# Patient Record
Sex: Male | Born: 1965
Health system: Southern US, Community
[De-identification: ages and names within clinical notes are randomized; demographics above are authoritative.]

## PROBLEM LIST (undated history)

## (undated) DIAGNOSIS — I10 Essential (primary) hypertension: Secondary | ICD-10-CM

## (undated) DIAGNOSIS — M1990 Unspecified osteoarthritis, unspecified site: Secondary | ICD-10-CM

## (undated) HISTORY — DX: Essential (primary) hypertension: I10

## (undated) HISTORY — PX: FINGER FRACTURE SURGERY: SHX638

## (undated) HISTORY — DX: Unspecified osteoarthritis, unspecified site: M19.90

---

## 2007-07-02 ENCOUNTER — Encounter: Admission: RE | Admit: 2007-07-02 | Discharge: 2007-07-02 | Payer: Self-pay | Admitting: Family Medicine

## 2011-09-22 ENCOUNTER — Ambulatory Visit (INDEPENDENT_AMBULATORY_CARE_PROVIDER_SITE_OTHER): Payer: 59 | Admitting: Family Medicine

## 2011-09-22 VITALS — BP 147/81 | HR 67 | Temp 98.0°F | Resp 20 | Ht 71.0 in | Wt 187.0 lb

## 2011-09-22 DIAGNOSIS — J019 Acute sinusitis, unspecified: Secondary | ICD-10-CM

## 2011-09-22 MED ORDER — CEFDINIR 300 MG PO CAPS: 300.0000 mg | ORAL_CAPSULE | Freq: Two times a day (BID) | ORAL | Status: AC

## 2011-09-22 NOTE — Progress Notes (Signed)
  Patient Name: Isaac Gray Date of Birth: [birth year] Medical Record Number: [id number] Gender: male Date of Encounter: 09/22/2011  History of Present Illness:  Isaac Gray is a 46 y.o. very pleasant male patient who presents with the following:  About a week ago noted minor congestion in his sinuses which has gotten worse.  Notes pressure and pain in his eyes.  Also chest congestion.  Cough- not very productive. No ST, a little bit of ringing/ pressure in his ears.  No fever or chills, today notes a poor energy level.  No aches.  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    There is no problem list on file for this patient.  History reviewed. No pertinent past medical history. Past Surgical History  Procedure Date  . Finger fracture surgery     at 46 years old   History  Substance Use Topics  . Smoking status: Never Smoker   . Smokeless tobacco: Never Used  . Alcohol Use: Yes     socially   Family History  Problem Relation Age of Onset  . Cancer Paternal Grandmother    No Known Allergies  Medication list has been reviewed and updated.  Review of Systems: As per HPI, otherwise negative.  No GI symptoms except poor appetite  Physical Examination: Filed Vitals:   09/22/11 1320  BP: 147/81  Pulse: 67  Temp: 98 F (36.7 C)  TempSrc: Oral  Resp: 20  Height: 5\' 11"  (1.803 m)  Weight: 187 lb (84.823 kg)    Body mass index is 26.08 kg/(m^2).  GEN: WDWN, NAD, Non-toxic, A & O x 3 HEENT: Atraumatic, Normocephalic. Neck supple. No masses, No LAD.  Mild frontal sinus TTP Ears and Nose: No external deformity.  Oropharynx and bilateral TM wnl CV: RRR, No M/G/R. No JVD. No thrill. No extra heart sounds. PULM: CTA B, no wheezes, crackles, rhonchi. No retractions. No resp. distress. No accessory muscle use. EXTR: No c/c/e NEURO Normal gait.  PSYCH: Normally interactive. Conversant. Not depressed or anxious appearing.  Calm demeanor.     Assessment and Plan: 1. Sinusitis, acute  cefdinir (OMNICEF) 300 MG capsule   Patient has a sinus infection.  This may be viral in origin, but he has been training for a 1/2 marathon which is this weekend and is concerned that he will not be able to run his race.  Will go ahead and cover with omnicef as above.  Encouraged rest, fluids, and let us know if he is not feeling better in the next few days- Sooner if worse.

## 2012-01-30 ENCOUNTER — Encounter: Payer: Self-pay | Admitting: *Deleted

## 2012-01-30 ENCOUNTER — Emergency Department: Admit: 2012-01-30 | Discharge: 2012-01-30 | Disposition: A | Discharge status: Home / Self Care | Payer: 59

## 2012-01-30 ENCOUNTER — Emergency Department
Admission: EM | Admit: 2012-01-30 | Discharge: 2012-01-30 | Disposition: A | Discharge status: Home / Self Care | Payer: 59 | Source: Home / Self Care | Attending: Family Medicine | Admitting: Family Medicine

## 2012-01-30 DIAGNOSIS — M940 Chondrocostal junction syndrome [Tietze]: Secondary | ICD-10-CM

## 2012-01-30 LAB — POCT CBC W AUTO DIFF (K'VILLE URGENT CARE)

## 2012-01-30 NOTE — ED Provider Notes (Signed)
History     CSN: [id number]  Arrival date & time 01/30/12  1204   First MD Initiated Contact with Patient 01/30/12 1221      Chief Complaint  Patient presents with  . Cough  . Chest Pain    tight     HPI Comments: Patient complains of two week history of vague constant tightness in his anterior chest, and increased fatigue with activity but no specific shortness of breath or wheezing.  He has had an occasional mild non-productive cough.  No pleuritic pain.  He has had mild nasal congestion and post nasal drainage.  No fevers, chills, and sweats.  He notes that several co-workers have had similar respiratory symptoms with chest tightness, and some with more cough than he.  He is able to run and exercise shortness of breath difficulty.                                                                                                   The history is provided by the patient.    History reviewed. No pertinent past medical history.  Past Surgical History  Procedure Date  . Finger fracture surgery     at 46 years old    Family History  Problem Relation Age of Onset  . Cancer Paternal Grandmother   . Hypertension Mother   . Diabetes Father     History  Substance Use Topics  . Smoking status: Never Smoker   . Smokeless tobacco: Never Used  . Alcohol Use: Yes     socially      Review of Systems No sore throat + cough, occasional No pleuritic pain, but complains of tightness in anterior chest No wheezing + nasal congestion, mild + post-nasal drainage No sinus pain/pressure No itchy/red eyes No earache No hemoptysis No SOB No fever/chills No nausea No vomiting No abdominal pain No diarrhea No urinary symptoms No skin rashes + fatigue No myalgias + headache   Allergies  Review of patient's allergies indicates no known allergies.  Home Medications  No current outpatient prescriptions on file.  BP 143/88  Pulse 60  Temp 98.4 F (36.9 C) (Oral)  Resp 14   Ht 5\' 11"  (1.803 m)  Wt 188 lb (85.276 kg)  BMI 26.22 kg/m2  SpO2 98%  Physical Exam Nursing notes and Vital Signs reviewed. Appearance:  Patient appears healthy, stated age, and in no acute distress Eyes:  Pupils are equal, round, and reactive to light and accomodation.  Extraocular movement is intact.  Conjunctivae are not inflamed  Ears:  Canals normal.  Tympanic membranes normal.  Nose:  Mildly congested turbinates.  No sinus tenderness.  Pharynx:  Normal Neck:  Supple.   No adenopathy Lungs:  Clear to auscultation.  Breath sounds are equal  Chest:  Nontender. Heart:  Regular rate and rhythm without murmurs, rubs, or gallops.  Abdomen:  Nontender without masses or hepatosplenomegaly.  Bowel sounds are present.  No CVA or flank tenderness.  Extremities:  No edema.  No calf tenderness Skin:  No rash present.   ED Course  Procedures  none   Labs Reviewed  POCT CBC W AUTO DIFF ([facility])  WBC 6.1; LY 27.8; MO 6.6; GR 65.8; Hgb 13.8; Platelets 221    Dg Chest 2 View  01/30/2012  *RADIOLOGY REPORT*  Clinical Data: Chest tightness  CHEST - 2 VIEW  Comparison: None.  Findings: Lungs are clear. No pleural effusion or pneumothorax. The cardiomediastinal contours are within normal limits. The visualized bones and soft tissues are without significant appreciable abnormality.  IMPRESSION: No radiographic evidence of acute cardiopulmonary process.  Original Report Authenticated By: Waneta Martins, M.D.     1. Costochondritis, acute       MDM   Take Ibuprofen 200mg , 4 tabs every 8 hours with food.  Followup with Family Doctor if not improved in two weeks        Lattie Haw, MD 01/30/12 1416

## 2012-01-30 NOTE — Discharge Instructions (Signed)
Recommend Ibuprofen 200mg , 4 tabs every 8 hours with food.    Costochondritis Costochondritis (Tietze syndrome), or costochondral separation, is a swelling and irritation (inflammation) of the tissue (cartilage) that connects your ribs with your breastbone (sternum). It may occur on its own (spontaneously), through damage caused by an accident (trauma), or simply from coughing or minor exercise. It may take up to 6 weeks to get better and longer if you are unable to be conservative in your activities. HOME CARE INSTRUCTIONS   Avoid exhausting physical activity. Try not to strain your ribs during normal activity. This would include any activities using chest, belly (abdominal), and side muscles, especially if heavy weights are used.   Use ice for 15 to 20 minutes per hour while awake for the first 2 days. Place the ice in a plastic bag, and place a towel between the bag of ice and your skin.   Only take over-the-counter or prescription medicines for pain, discomfort, or fever as directed by your caregiver.  SEEK IMMEDIATE MEDICAL CARE IF:   Your pain increases or you are very uncomfortable.   You have a fever.   You develop difficulty with your breathing.   You cough up blood.   You develop worse chest pains, shortness of breath, sweating, or vomiting.   You develop new, unexplained problems (symptoms).  MAKE SURE YOU:   Understand these instructions.   Will watch your condition.   Will get help right away if you are not doing well or get worse.  Document Released: 05/07/2005 Document Revised: 07/17/2011 Document Reviewed: 03/15/2008 [facility] Patient Information 2012 [place], [place].

## 2012-01-30 NOTE — ED Notes (Signed)
Patient c/o chest tightness, cough and fatigue x 2 weeks.

## 2016-05-26 ENCOUNTER — Ambulatory Visit (INDEPENDENT_AMBULATORY_CARE_PROVIDER_SITE_OTHER): Payer: 59 | Admitting: Family

## 2016-05-26 ENCOUNTER — Encounter: Payer: Self-pay | Admitting: Family

## 2016-05-26 ENCOUNTER — Other Ambulatory Visit (INDEPENDENT_AMBULATORY_CARE_PROVIDER_SITE_OTHER): Payer: 59

## 2016-05-26 VITALS — BP 132/98 | HR 67 | Temp 98.3°F | Resp 16 | Ht 71.0 in | Wt 205.8 lb

## 2016-05-26 DIAGNOSIS — Z0000 Encounter for general adult medical examination without abnormal findings: Secondary | ICD-10-CM

## 2016-05-26 DIAGNOSIS — Z0001 Encounter for general adult medical examination with abnormal findings: Secondary | ICD-10-CM | POA: Insufficient documentation

## 2016-05-26 DIAGNOSIS — Z23 Encounter for immunization: Secondary | ICD-10-CM

## 2016-05-26 LAB — CBC
HCT: 41.9 % (ref 39.0–52.0)
Hemoglobin: 14.3 g/dL (ref 13.0–17.0)
MCHC: 34.2 g/dL (ref 30.0–36.0)
MCV: 93 fl (ref 78.0–100.0)
Platelets: 270 10*3/uL (ref 150.0–400.0)
RBC: 4.51 Mil/uL (ref 4.22–5.81)
RDW: 13.3 % (ref 11.5–15.5)
WBC: 7 10*3/uL (ref 4.0–10.5)

## 2016-05-26 LAB — COMPREHENSIVE METABOLIC PANEL
ALT: 15 U/L (ref 0–53)
AST: 17 U/L (ref 0–37)
Albumin: 4.8 g/dL (ref 3.5–5.2)
Alkaline Phosphatase: 51 U/L (ref 39–117)
BILIRUBIN TOTAL: 0.5 mg/dL (ref 0.2–1.2)
BUN: 17 mg/dL (ref 6–23)
CHLORIDE: 104 meq/L (ref 96–112)
CO2: 31 meq/L (ref 19–32)
CREATININE: 1.12 mg/dL (ref 0.40–1.50)
Calcium: 10.2 mg/dL (ref 8.4–10.5)
GFR: 74.03 mL/min (ref >60.00)
Glucose, Bld: 99 mg/dL (ref 70–99)
Potassium: 5.6 mEq/L — ABNORMAL HIGH (ref 3.5–5.1)
SODIUM: 142 meq/L (ref 135–145)
Total Protein: 7.5 g/dL (ref 6.0–8.3)

## 2016-05-26 LAB — LIPID PANEL
CHOL/HDL RATIO: 2
Cholesterol: 143 mg/dL (ref 0–200)
HDL: 59 mg/dL (ref >39.00)
LDL CALC: 65 mg/dL (ref 0–99)
NONHDL: 83.95
Triglycerides: 96 mg/dL (ref 0.0–149.0)
VLDL: 19.2 mg/dL (ref 0.0–40.0)

## 2016-05-26 NOTE — Patient Instructions (Signed)
Thank you for choosing [facility].  SUMMARY AND INSTRUCTIONS:   Labs:  Please stop by the lab on the lower level of the building for your blood work. Your results will be released to MyChart (or called to you) after review, usually within 72 hours after test completion. If any changes need to be made, you will be notified at that same time.  1.) The lab is open from 7:30am to 5:30 pm Monday-Friday 2.) No appointment is necessary 3.) Fasting (if needed) is 6-8 hours after food and drink; black coffee and water are okay   Health Maintenance, Male A healthy lifestyle and preventative care can promote health and wellness.  Maintain regular health, dental, and eye exams.  Eat a healthy diet. Foods like vegetables, fruits, whole grains, low-fat dairy products, and lean protein foods contain the nutrients you need and are low in calories. Decrease your intake of foods high in solid fats, added sugars, and salt. Get information about a proper diet from your health care provider, if necessary.  Regular physical exercise is one of the most important things you can do for your health. Most adults should get at least 150 minutes of moderate-intensity exercise (any activity that increases your heart rate and causes you to sweat) each week. In addition, most adults need muscle-strengthening exercises on 2 or more days a week.   Maintain a healthy weight. The body mass index (BMI) is a screening tool to identify possible weight problems. It provides an estimate of body fat based on height and weight. Your health care provider can find your BMI and can help you achieve or maintain a healthy weight. For males 20 years and older:  A BMI below 18.5 is considered underweight.  A BMI of 18.5 to 24.9 is normal.  A BMI of 25 to 29.9 is considered overweight.  A BMI of 30 and above is considered obese.  Maintain normal blood lipids and cholesterol by exercising and minimizing your intake of  saturated fat. Eat a balanced diet with plenty of fruits and vegetables. Blood tests for lipids and cholesterol should begin at age 20 and be repeated every 5 years. If your lipid or cholesterol levels are high, you are over age 50, or you are at high risk for heart disease, you may need your cholesterol levels checked more frequently.Ongoing high lipid and cholesterol levels should be treated with medicines if diet and exercise are not working.  If you smoke, find out from your health care provider how to quit. If you do not use tobacco, do not start.  Lung cancer screening is recommended for adults aged 55-80 years who are at high risk for developing lung cancer because of a history of smoking. A yearly low-dose CT scan of the lungs is recommended for people who have at least a 30-pack-year history of smoking and are current smokers or have quit within the past 15 years. A pack year of smoking is smoking an average of 1 pack of cigarettes a day for 1 year (for example, a 30-pack-year history of smoking could mean smoking 1 pack a day for 30 years or 2 packs a day for 15 years). Yearly screening should continue until the smoker has stopped smoking for at least 15 years. Yearly screening should be stopped for people who develop a health problem that would prevent them from having lung cancer treatment.  If you choose to drink alcohol, do not have more than 2 drinks per day. One drink is considered   to be 12 oz (360 mL) of beer, 5 oz (150 mL) of wine, or 1.5 oz (45 mL) of liquor.  Avoid the use of street drugs. Do not share needles with anyone. Ask for help if you need support or instructions about stopping the use of drugs.  High blood pressure causes heart disease and increases the risk of stroke. High blood pressure is more likely to develop in:  People who have blood pressure in the end of the normal range (100-139/85-89 mm Hg).  People who are overweight or obese.  People who are African  American.  If you are 18-39 years of age, have your blood pressure checked every 3-5 years. If you are 40 years of age or older, have your blood pressure checked every year. You should have your blood pressure measured twice--once when you are at a hospital or clinic, and once when you are not at a hospital or clinic. Record the average of the two measurements. To check your blood pressure when you are not at a hospital or clinic, you can use:  An automated blood pressure machine at a pharmacy.  A home blood pressure monitor.  If you are 45-79 years old, ask your health care provider if you should take aspirin to prevent heart disease.  Diabetes screening involves taking a blood sample to check your fasting blood sugar level. This should be done once every 3 years after age 45 if you are at a normal weight and without risk factors for diabetes. Testing should be considered at a younger age or be carried out more frequently if you are overweight and have at least 1 risk factor for diabetes.  Colorectal cancer can be detected and often prevented. Most routine colorectal cancer screening begins at the age of 50 and continues through age 75. However, your health care provider may recommend screening at an earlier age if you have risk factors for colon cancer. On a yearly basis, your health care provider may provide home test kits to check for hidden blood in the stool. A small camera at the end of a tube may be used to directly examine the colon (sigmoidoscopy or colonoscopy) to detect the earliest forms of colorectal cancer. Talk to your health care provider about this at age 50 when routine screening begins. A direct exam of the colon should be repeated every 5-10 years through age 75, unless early forms of precancerous polyps or small growths are found.  People who are at an increased risk for hepatitis B should be screened for this virus. You are considered at high risk for hepatitis B if:  You were  born in a country where hepatitis B occurs often. Talk with your health care provider about which countries are considered high risk.  Your parents were born in a high-risk country and you have not received a shot to protect against hepatitis B (hepatitis B vaccine).  You have HIV or AIDS.  You use needles to inject street drugs.  You live with, or have sex with, someone who has hepatitis B.  You are a man who has sex with other men (MSM).  You get hemodialysis treatment.  You take certain medicines for conditions like cancer, organ transplantation, and autoimmune conditions.  Hepatitis C blood testing is recommended for all people born from 1945 through 1965 and any individual with known risk factors for hepatitis C.  Healthy men should no longer receive prostate-specific antigen (PSA) blood tests as part of routine cancer screening. Talk   to your health care provider about prostate cancer screening.  Testicular cancer screening is not recommended for adolescents or adult males who have no symptoms. Screening includes self-exam, a health care provider exam, and other screening tests. Consult with your health care provider about any symptoms you have or any concerns you have about testicular cancer.  Practice safe sex. Use condoms and avoid high-risk sexual practices to reduce the spread of sexually transmitted infections (STIs).  You should be screened for STIs, including gonorrhea and chlamydia if:  You are sexually active and are younger than 24 years.  You are older than 24 years, and your health care provider tells you that you are at risk for this type of infection.  Your sexual activity has changed since you were last screened, and you are at an increased risk for chlamydia or gonorrhea. Ask your health care provider if you are at risk.  If you are at risk of being infected with HIV, it is recommended that you take a prescription medicine daily to prevent HIV infection. This is  called pre-exposure prophylaxis (PrEP). You are considered at risk if:  You are a man who has sex with other men (MSM).  You are a heterosexual man who is sexually active with multiple partners.  You take drugs by injection.  You are sexually active with a partner who has HIV.  Talk with your health care provider about whether you are at high risk of being infected with HIV. If you choose to begin PrEP, you should first be tested for HIV. You should then be tested every 3 months for as long as you are taking PrEP.  Use sunscreen. Apply sunscreen liberally and repeatedly throughout the day. You should seek shade when your shadow is shorter than you. Protect yourself by wearing long sleeves, pants, a wide-brimmed hat, and sunglasses year round whenever you are outdoors.  Tell your health care provider of new moles or changes in moles, especially if there is a change in shape or color. Also, tell your health care provider if a mole is larger than the size of a pencil eraser.  A one-time screening for abdominal aortic aneurysm (AAA) and surgical repair of large AAAs by ultrasound is recommended for men aged 65-75 years who are current or former smokers.  Stay current with your vaccines (immunizations).   This information is not intended to replace advice given to you by your health care provider. Make sure you discuss any questions you have with your health care provider.   Document Released: 01/24/2008 Document Revised: 08/18/2014 Document Reviewed: 12/23/2010 Elsevier Interactive Patient Education 2016 [employer]    

## 2016-05-26 NOTE — Assessment & Plan Note (Signed)
1) Anticipatory Guidance: Discussed importance of wearing a seatbelt while driving and not texting while driving; changing batteries in smoke detector at least once annually; wearing suntan lotion when outside; eating a balanced and moderate diet; getting physical activity at least 30 minutes per day.  2) Immunizations / Screenings / Labs:  Influenza updated today. All other immunizations are up-to-date per recommendations. All screenings are up-to-date per recommendations. Obtain CBC, CMET, and lipid profile.    Overall well exam with minimal risk factors for cardiovascular disease noted at present. Does have slightly elevated blood pressure which we will continue to monitor. No symptoms of end organ damage. He is of good weight for his height. Exercises regularly. Continue healthy lifestyle behaviors and choices. Follow-up prevention exam in 1 year. Follow-up office visit pending blood work if needed.

## 2016-05-26 NOTE — Progress Notes (Signed)
Subjective:    Patient ID: Isaac Gray, male    DOB: 10/05/1965, 50 y.o.   MRN: [id number]  Chief Complaint  Patient presents with  . Establish Care    CPE, fasting    HPI:  Isaac Gray is a 50 y.o. male who presents today for an annual wellness visit.   1) Health Maintenance -   Diet - Averages about 2-3 meals per day consisting of a regular diet. Caffeine intake of about 4-6 cups per day.   Exercise - Daily; Mixture of cardio and resistance.    2) Preventative Exams / Immunizations:  Dental -- Up to date  Vision -- Up to date   Health Maintenance  Topic Date Due  . HIV Screening  04/18/1982  . INFLUENZA VACCINE  03/11/2016  . TETANUS/TDAP  11/23/2017    Immunization History  Administered Date(s) Administered  . Influenza,inj,Quad PF,36+ Mos 05/26/2016     No Known Allergies   No outpatient prescriptions prior to visit.   No facility-administered medications prior to visit.      Past Medical History:  Diagnosis Date  . Arthritis   . Hypertension      Past Surgical History:  Procedure Laterality Date  . FINGER FRACTURE SURGERY     at 50 years old     Family History  Problem Relation Age of Onset  . Hypertension Mother   . Diabetes Father   . Lung cancer Paternal Grandmother   . Stroke Maternal Grandmother   . Heart disease Maternal Grandfather   . Bladder Cancer Paternal Grandfather      Social History   Social History  . Marital status: Single    Spouse name: N/A  . Number of children: 0  . Years of education: 8216   Occupational History  . Emergency planning/management officerolice Officer    Social History Main Topics  . Smoking status: Never Smoker  . Smokeless tobacco: Never Used  . Alcohol use 7.2 oz/week    12 Cans of beer per week     Comment: socially  . Drug use: No  . Sexual activity: Yes   Other Topics Concern  . Not on file   Social History Narrative  . No narrative on file     Review of Systems  Constitutional: Denies  fever, chills, fatigue, or significant weight gain/loss. HENT: Head: Denies headache or neck pain Ears: Denies changes in hearing, ringing in ears, earache, drainage Nose: Denies discharge, stuffiness, itching, nosebleed, sinus pain Throat: Denies sore throat, hoarseness, dry mouth, sores, thrush Eyes: Denies loss/changes in vision, pain, redness, blurry/double vision, flashing lights Cardiovascular: Denies chest pain/discomfort, tightness, palpitations, shortness of breath with activity, difficulty lying down, swelling, sudden awakening with shortness of breath Respiratory: Denies shortness of breath, cough, sputum production, wheezing Gastrointestinal: Denies dysphasia, heartburn, change in appetite, nausea, change in bowel habits, rectal bleeding, constipation, diarrhea, yellow skin or eyes Genitourinary: Denies frequency, urgency, burning/pain, blood in urine, incontinence, change in urinary strength. Musculoskeletal: Denies muscle/joint pain, stiffness, back pain, redness or swelling of joints, trauma Skin: Denies rashes, lumps, itching, dryness, color changes, or hair/nail changes Neurological: Denies dizziness, fainting, seizures, weakness, numbness, tingling, tremor Psychiatric - Denies nervousness, stress, depression or memory loss Endocrine: Denies heat or cold intolerance, sweating, frequent urination, excessive thirst, changes in appetite Hematologic: Denies ease of bruising or bleeding     Objective:     BP (!) 132/98 (BP Location: Left Arm, Patient Position: Sitting, Cuff Size: Normal)   Pulse 67  Temp 98.3 F (36.8 C) (Oral)   Resp 16   Ht 5\' 11"  (1.803 m)   Wt 205 lb 12.8 oz (93.4 kg)   SpO2 99%   BMI 28.70 kg/m  Nursing note and vital signs reviewed.  Physical Exam  Constitutional: He is oriented to person, place, and time. He appears well-developed and well-nourished.  HENT:  Head: Normocephalic.  Right Ear: Hearing, tympanic membrane, external ear and ear  canal normal.  Left Ear: Hearing, tympanic membrane, external ear and ear canal normal.  Nose: Nose normal.  Mouth/Throat: Uvula is midline, oropharynx is clear and moist and mucous membranes are normal.  Eyes: Conjunctivae and EOM are normal. Pupils are equal, round, and reactive to light.  Neck: Neck supple. No JVD present. No tracheal deviation present. No thyromegaly present.  Cardiovascular: Normal rate, regular rhythm, normal heart sounds and intact distal pulses.   Pulmonary/Chest: Effort normal and breath sounds normal.  Abdominal: Soft. Bowel sounds are normal. He exhibits no distension and no mass. There is no tenderness. There is no rebound and no guarding.  Musculoskeletal: Normal range of motion. He exhibits no edema or tenderness.  Lymphadenopathy:    He has no cervical adenopathy.  Neurological: He is alert and oriented to person, place, and time. He has normal reflexes. No cranial nerve deficit. He exhibits normal muscle tone. Coordination normal.  Skin: Skin is warm and dry.  Psychiatric: He has a normal mood and affect. His behavior is normal. Judgment and thought content normal.       Assessment & Plan:   Problem List Items Addressed This Visit      Other   Routine general medical examination at a health care facility - Primary    1) Anticipatory Guidance: Discussed importance of wearing a seatbelt while driving and not texting while driving; changing batteries in smoke detector at least once annually; wearing suntan lotion when outside; eating a balanced and moderate diet; getting physical activity at least 30 minutes per day.  2) Immunizations / Screenings / Labs:  Influenza updated today. All other immunizations are up-to-date per recommendations. All screenings are up-to-date per recommendations. Obtain CBC, CMET, and lipid profile.    Overall well exam with minimal risk factors for cardiovascular disease noted at present. Does have slightly elevated blood  pressure which we will continue to monitor. No symptoms of end organ damage. He is of good weight for his height. Exercises regularly. Continue healthy lifestyle behaviors and choices. Follow-up prevention exam in 1 year. Follow-up office visit pending blood work if needed.      Relevant Orders   CBC (Completed)   Comprehensive metabolic panel (Completed)   Lipid panel (Completed)    Other Visit Diagnoses    Encounter for immunization       Relevant Orders   Flu Vaccine QUAD 36+ mos IM (Completed)       Mr. Jakubiak does not currently have medications on file.   Follow-up: Return in about 6 months (around 11/24/2016), or if symptoms worsen or fail to improve.   Jeanine Luz, FNP

## 2016-05-27 ENCOUNTER — Other Ambulatory Visit: Payer: Self-pay | Admitting: Family

## 2016-05-27 MED ORDER — HYDROCHLOROTHIAZIDE 25 MG PO TABS: 25.0000 mg | ORAL_TABLET | Freq: Every day | ORAL | 0 refills | Status: DC

## 2016-06-26 ENCOUNTER — Other Ambulatory Visit: Payer: Self-pay | Admitting: Emergency Medicine

## 2016-06-26 ENCOUNTER — Other Ambulatory Visit (INDEPENDENT_AMBULATORY_CARE_PROVIDER_SITE_OTHER): Payer: 59

## 2016-06-26 DIAGNOSIS — Z5181 Encounter for therapeutic drug level monitoring: Secondary | ICD-10-CM

## 2016-06-26 LAB — COMPREHENSIVE METABOLIC PANEL
ALK PHOS: 52 U/L (ref 39–117)
ALT: 15 U/L (ref 0–53)
AST: 18 U/L (ref 0–37)
Albumin: 4.5 g/dL (ref 3.5–5.2)
BUN: 12 mg/dL (ref 6–23)
CHLORIDE: 102 meq/L (ref 96–112)
CO2: 32 mEq/L (ref 19–32)
Calcium: 9.5 mg/dL (ref 8.4–10.5)
Creatinine, Ser: 1.12 mg/dL (ref 0.40–1.50)
GFR: 74.01 mL/min (ref >60.00)
GLUCOSE: 98 mg/dL (ref 70–99)
POTASSIUM: 4.1 meq/L (ref 3.5–5.1)
Sodium: 140 mEq/L (ref 135–145)
TOTAL PROTEIN: 7 g/dL (ref 6.0–8.3)
Total Bilirubin: 0.7 mg/dL (ref 0.2–1.2)

## 2016-06-26 NOTE — Progress Notes (Unsigned)
Pt came in to have potassium re-check, labs entered.

## 2017-07-31 ENCOUNTER — Encounter: Payer: 59 | Admitting: Internal Medicine

## 2017-08-17 ENCOUNTER — Ambulatory Visit
Admission: RE | Admit: 2017-08-17 | Discharge: 2017-08-17 | Disposition: A | Discharge status: Home / Self Care | Payer: 59 | Source: Ambulatory Visit | Attending: Internal Medicine | Admitting: Internal Medicine

## 2017-08-17 ENCOUNTER — Other Ambulatory Visit (INDEPENDENT_AMBULATORY_CARE_PROVIDER_SITE_OTHER): Payer: 59

## 2017-08-17 ENCOUNTER — Ambulatory Visit (INDEPENDENT_AMBULATORY_CARE_PROVIDER_SITE_OTHER): Payer: 59 | Admitting: Internal Medicine

## 2017-08-17 ENCOUNTER — Encounter: Payer: Self-pay | Admitting: Internal Medicine

## 2017-08-17 VITALS — BP 146/98 | HR 84 | Temp 98.6°F | Ht 71.0 in | Wt 210.0 lb

## 2017-08-17 DIAGNOSIS — M25551 Pain in right hip: Secondary | ICD-10-CM | POA: Diagnosis not present

## 2017-08-17 DIAGNOSIS — Z114 Encounter for screening for human immunodeficiency virus [HIV]: Secondary | ICD-10-CM | POA: Diagnosis not present

## 2017-08-17 DIAGNOSIS — I10 Essential (primary) hypertension: Secondary | ICD-10-CM

## 2017-08-17 DIAGNOSIS — Z23 Encounter for immunization: Secondary | ICD-10-CM

## 2017-08-17 DIAGNOSIS — Z0001 Encounter for general adult medical examination with abnormal findings: Secondary | ICD-10-CM

## 2017-08-17 DIAGNOSIS — M1990 Unspecified osteoarthritis, unspecified site: Secondary | ICD-10-CM | POA: Insufficient documentation

## 2017-08-17 DIAGNOSIS — R030 Elevated blood-pressure reading, without diagnosis of hypertension: Secondary | ICD-10-CM | POA: Insufficient documentation

## 2017-08-17 LAB — HEPATIC FUNCTION PANEL
ALT: 20 U/L (ref 0–53)
AST: 23 U/L (ref 0–37)
Albumin: 4.8 g/dL (ref 3.5–5.2)
Alkaline Phosphatase: 61 U/L (ref 39–117)
BILIRUBIN TOTAL: 0.8 mg/dL (ref 0.2–1.2)
Bilirubin, Direct: 0.2 mg/dL (ref 0.0–0.3)
Total Protein: 7.1 g/dL (ref 6.0–8.3)

## 2017-08-17 LAB — CBC WITH DIFFERENTIAL/PLATELET
BASOS PCT: 0.7 % (ref 0.0–3.0)
Basophils Absolute: 0 10*3/uL (ref 0.0–0.1)
Eosinophils Absolute: 0.1 10*3/uL (ref 0.0–0.7)
Eosinophils Relative: 0.8 % (ref 0.0–5.0)
HCT: 43.4 % (ref 39.0–52.0)
Hemoglobin: 14.7 g/dL (ref 13.0–17.0)
LYMPHS ABS: 1.4 10*3/uL (ref 0.7–4.0)
Lymphocytes Relative: 20.3 % (ref 12.0–46.0)
MCHC: 33.9 g/dL (ref 30.0–36.0)
MCV: 92.8 fl (ref 78.0–100.0)
MONO ABS: 0.4 10*3/uL (ref 0.1–1.0)
Monocytes Relative: 5.4 % (ref 3.0–12.0)
NEUTROS ABS: 5.2 10*3/uL (ref 1.4–7.7)
NEUTROS PCT: 72.8 % (ref 43.0–77.0)
PLATELETS: 272 10*3/uL (ref 150.0–400.0)
RBC: 4.68 Mil/uL (ref 4.22–5.81)
RDW: 13.2 % (ref 11.5–15.5)
WBC: 7.1 10*3/uL (ref 4.0–10.5)

## 2017-08-17 LAB — URINALYSIS, ROUTINE W REFLEX MICROSCOPIC
Bilirubin Urine: NEGATIVE
Hgb urine dipstick: NEGATIVE
KETONES UR: NEGATIVE
Leukocytes, UA: NEGATIVE
Nitrite: NEGATIVE
PH: 6.5 (ref 5.0–8.0)
SPECIFIC GRAVITY, URINE: 1.015 (ref 1.000–1.030)
Total Protein, Urine: NEGATIVE
URINE GLUCOSE: NEGATIVE
UROBILINOGEN UA: 0.2 (ref 0.0–1.0)

## 2017-08-17 LAB — LIPID PANEL
CHOL/HDL RATIO: 3
Cholesterol: 217 mg/dL — ABNORMAL HIGH (ref 0–200)
HDL: 62.1 mg/dL (ref >39.00)
LDL Cholesterol: 131 mg/dL — ABNORMAL HIGH (ref 0–99)
NonHDL: 154.66
TRIGLYCERIDES: 116 mg/dL (ref 0.0–149.0)
VLDL: 23.2 mg/dL (ref 0.0–40.0)

## 2017-08-17 LAB — BASIC METABOLIC PANEL
BUN: 11 mg/dL (ref 6–23)
CO2: 26 meq/L (ref 19–32)
Calcium: 9.8 mg/dL (ref 8.4–10.5)
Chloride: 103 mEq/L (ref 96–112)
Creatinine, Ser: 1.07 mg/dL (ref 0.40–1.50)
GFR: 77.65 mL/min (ref >60.00)
GLUCOSE: 111 mg/dL — AB (ref 70–99)
POTASSIUM: 5.3 meq/L — AB (ref 3.5–5.1)
SODIUM: 142 meq/L (ref 135–145)

## 2017-08-17 LAB — PSA: PSA: 0.61 ng/mL (ref 0.10–4.00)

## 2017-08-17 LAB — TSH: TSH: 1.48 u[IU]/mL (ref 0.35–4.50)

## 2017-08-17 NOTE — Patient Instructions (Addendum)
You had the flu shot today  Please check your blood pressure daily for 1 week, and call (or mychart email) with average Blood Pressure in 1 week  You will be contacted regarding the referral for: colonoscopy  Please continue all other medications as before, and refills have been done if requested.  Please have the pharmacy call with any other refills you may need.  Please continue your efforts at being more active, low cholesterol diet, and weight control.  You are otherwise up to date with prevention measures today.  Please keep your appointments with your specialists as you may have planned  Please go to the XRAY Department in the Basement (go straight as you get off the elevator) for the x-ray testing  Please go to the LAB in the Basement (turn left off the elevator) for the tests to be done today  You will be contacted by phone if any changes need to be made immediately.  Otherwise, you will receive a letter about your results with an explanation, but please check with MyChart first.  Please remember to sign up for MyChart if you have not done so, as this will be important to you in the future with finding out test results, communicating by private email, and scheduling acute appointments online when needed.  Please return in 1 year for your yearly visit, or sooner if needed, with Lab testing done 3-5 days before

## 2017-08-17 NOTE — Assessment & Plan Note (Signed)

## 2017-08-17 NOTE — Assessment & Plan Note (Addendum)
C/w likely mod to severe DJD, for ortho referral  In addition to the time spent performing CPE, I spent an additional 15 minutes face to face,in which greater than 50% of this time was spent in counseling and coordination of care for patient's illness as documented, including the differential dx, treatment, further evaluation and other management of right hip pain and HTN

## 2017-08-17 NOTE — Progress Notes (Signed)
Subjective:    Patient ID: Isaac Gray, male    DOB: 12/19/1965, 52 y.o.   MRN: [id number]  HPI  Here for wellness and f/u;  Overall doing ok;  Pt denies Chest pain, worsening SOB, DOE, wheezing, orthopnea, PND, worsening LE edema, palpitations, dizziness or syncope.  Pt denies neurological change such as new headache, facial or extremity weakness.  Pt denies polydipsia, polyuria, or low sugar symptoms. Pt states overall good compliance with treatment and medications, good tolerability, and has been trying to follow appropriate diet.  Pt denies worsening depressive symptoms, suicidal ideation or panic. No fever, night sweats, wt loss, loss of appetite, or other constitutional symptoms.  Pt states good ability with ADL's, has low fall risk, home safety reviewed and adequate, no other significant changes in hearing or vision, and only occasionally active with exercise.  No other complaints or interval hx except for worsening acute on chronic right hip pain, has been putting off any surgury for some time, but now moderate, constant, worse to ambulation, better to sit or lie down, nothing else makes better or worse Past Medical History:  Diagnosis Date  . Arthritis   . Hypertension    Past Surgical History:  Procedure Laterality Date  . FINGER FRACTURE SURGERY     at 52 years old    reports that  has never smoked. he has never used smokeless tobacco. He reports that he drinks about 7.2 oz of alcohol per week. He reports that he does not use drugs. family history includes Bladder Cancer in his paternal grandfather; Diabetes in his father; Heart disease in his maternal grandfather; Hypertension in his mother; Lung cancer in his paternal grandmother; Stroke in his maternal grandmother. No Known Allergies No current outpatient medications on file prior to visit.   No current facility-administered medications on file prior to visit.    Review of Systems Constitutional: Negative for other  unusual diaphoresis, sweats, appetite or weight changes HENT: Negative for other worsening hearing loss, ear pain, facial swelling, mouth sores or neck stiffness.   Eyes: Negative for other worsening pain, redness or other visual disturbance.  Respiratory: Negative for other stridor or swelling Cardiovascular: Negative for other palpitations or other chest pain  Gastrointestinal: Negative for worsening diarrhea or loose stools, blood in stool, distention or other pain Genitourinary: Negative for hematuria, flank pain or other change in urine volume.  Musculoskeletal: Negative for myalgias or other joint swelling.  Skin: Negative for other color change, or other wound or worsening drainage.  Neurological: Negative for other syncope or numbness. Hematological: Negative for other adenopathy or swelling Psychiatric/Behavioral: Negative for hallucinations, other worsening agitation, SI, self-injury, or new decreased concentration All other system neg per pt    Objective:   Physical Exam BP (!) 146/98   Pulse 84   Temp 98.6 F (37 C) (Oral)   Ht 5\' 11"  (1.803 m)   Wt 210 lb (95.3 kg)   SpO2 96%   BMI 29.29 kg/m  VS noted,  Constitutional: Pt is oriented to person, place, and time. Appears well-developed and well-nourished, in no significant distress and comfortable Head: Normocephalic and atraumatic  Eyes: Conjunctivae and EOM are normal. Pupils are equal, round, and reactive to light Right Ear: External ear normal without discharge Left Ear: External ear normal without discharge Nose: Nose without discharge or deformity Mouth/Throat: Oropharynx is without other ulcerations and moist  Neck: Normal range of motion. Neck supple. No JVD present. No tracheal deviation present or significant neck LA  or mass Cardiovascular: Normal rate, regular rhythm, normal heart sounds and intact distal pulses.   Pulmonary/Chest: WOB normal and breath sounds without rales or wheezing  Abdominal: Soft.  Bowel sounds are normal. NT. No HSM  Musculoskeletal: Normal range of motion except severe decreased right hip forward flexion and int/ext rotation,. Exhibits no edema Lymphadenopathy: Has no other cervical adenopathy.  Neurological: Pt is alert and oriented to person, place, and time. Pt has normal reflexes. No cranial nerve deficit. Motor grossly intact, Gait intact Skin: Skin is warm and dry. No rash noted or new ulcerations Psychiatric:  Has normal mood and affect. Behavior is normal without agitation No other exam findings Lab Results  Component Value Date   WBC 7.1 08/17/2017   HGB 14.7 08/17/2017   HCT 43.4 08/17/2017   PLT 272.0 08/17/2017   GLUCOSE 111 (H) 08/17/2017   CHOL 217 (H) 08/17/2017   TRIG 116.0 08/17/2017   HDL 62.10 08/17/2017   LDLCALC 131 (H) 08/17/2017   ALT 20 08/17/2017   AST 23 08/17/2017   NA 142 08/17/2017   K 5.3 (H) 08/17/2017   CL 103 08/17/2017   CREATININE 1.07 08/17/2017   BUN 11 08/17/2017   CO2 26 08/17/2017   TSH 1.48 08/17/2017   PSA 0.61 08/17/2017      Assessment & Plan:

## 2017-08-17 NOTE — Assessment & Plan Note (Signed)
BP Readings from Last 3 Encounters:  08/17/17 (!) 146/98  05/26/16 (!) 132/98  01/30/12 143/88  uncontrolled it seems, but pt unsure and wants to continue to monitor BP at home for 1 wk and call with average

## 2017-08-18 ENCOUNTER — Other Ambulatory Visit (INDEPENDENT_AMBULATORY_CARE_PROVIDER_SITE_OTHER): Payer: 59

## 2017-08-18 DIAGNOSIS — R739 Hyperglycemia, unspecified: Secondary | ICD-10-CM

## 2017-08-18 LAB — HEMOGLOBIN A1C: HEMOGLOBIN A1C: 5.4 % (ref 4.6–6.5)

## 2017-08-18 LAB — HIV ANTIBODY (ROUTINE TESTING W REFLEX): HIV 1&2 Ab, 4th Generation: NONREACTIVE

## 2017-08-21 ENCOUNTER — Encounter: Payer: Self-pay | Admitting: Internal Medicine

## 2017-10-23 ENCOUNTER — Ambulatory Visit (AMBULATORY_SURGERY_CENTER): Payer: Self-pay

## 2017-10-23 ENCOUNTER — Encounter: Payer: Self-pay | Admitting: Internal Medicine

## 2017-10-23 VITALS — Ht 71.0 in | Wt 206.0 lb

## 2017-10-23 DIAGNOSIS — Z1211 Encounter for screening for malignant neoplasm of colon: Secondary | ICD-10-CM

## 2017-10-23 NOTE — Progress Notes (Signed)
Per pt, no allergies to soy or egg products.Pt not taking any weight loss meds or using  O2 at home.  Pt refused emmi video. 

## 2017-11-06 ENCOUNTER — Other Ambulatory Visit: Payer: Self-pay

## 2017-11-06 ENCOUNTER — Ambulatory Visit (AMBULATORY_SURGERY_CENTER): Payer: 59 | Admitting: Internal Medicine

## 2017-11-06 ENCOUNTER — Encounter: Payer: Self-pay | Admitting: Internal Medicine

## 2017-11-06 VITALS — BP 133/87 | HR 76 | Temp 97.3°F | Resp 19 | Ht 71.0 in | Wt 210.0 lb

## 2017-11-06 DIAGNOSIS — Z1212 Encounter for screening for malignant neoplasm of rectum: Secondary | ICD-10-CM

## 2017-11-06 DIAGNOSIS — Z1211 Encounter for screening for malignant neoplasm of colon: Secondary | ICD-10-CM

## 2017-11-06 MED ORDER — SODIUM CHLORIDE 0.9 % IV SOLN: 500.0000 mL | Freq: Once | INTRAVENOUS | Status: DC

## 2017-11-06 NOTE — Op Note (Signed)
[facility] Patient Name: Isaac Gray Procedure Date: 11/06/2017 9:01 AM MRN: [id number] Endoscopist: Iva Boop , MD Age: 52 Referring MD:  Date of Birth: 1966/03/12 Gender: Male Account #: [id number] Procedure:                Colonoscopy Indications:              Screening for colorectal malignant neoplasm, This                            is the patient's first colonoscopy Medicines:                Propofol per Anesthesia, Monitored Anesthesia Care Procedure:                Pre-Anesthesia Assessment:                           - Prior to the procedure, a History and Physical                            was performed, and patient medications and                            allergies were reviewed. The patient's tolerance of                            previous anesthesia was also reviewed. The risks                            and benefits of the procedure and the sedation                            options and risks were discussed with the patient.                            All questions were answered, and informed consent                            was obtained. Prior Anticoagulants: The patient has                            taken no previous anticoagulant or antiplatelet                            agents. ASA Grade Assessment: II - A patient with                            mild systemic disease. After reviewing the risks                            and benefits, the patient was deemed in                            satisfactory condition to undergo the procedure.  After obtaining informed consent, the colonoscope                            was passed under direct vision. Throughout the                            procedure, the patient's blood pressure, pulse, and                            oxygen saturations were monitored continuously. The                            Colonoscope was introduced through the anus and   advanced to the the cecum, identified by                            appendiceal orifice and ileocecal valve. The                            colonoscopy was performed without difficulty. The                            patient tolerated the procedure well. The quality                            of the bowel preparation was excellent. The                            ileocecal valve, appendiceal orifice, and rectum                            were photographed. The bowel preparation used was                            Miralax. Scope In: 9:14:35 AM Scope Out: 9:26:24 AM Scope Withdrawal Time: 0 hours 8 minutes 59 seconds  Total Procedure Duration: 0 hours 11 minutes 49 seconds  Findings:                 The perianal and digital rectal examinations were                            normal.                           Multiple diverticula were found in the sigmoid                            colon.                           The exam was otherwise without abnormality on                            direct and retroflexion views. Complications:            No immediate complications. Estimated  Blood Loss:     Estimated blood loss: none. Impression:               - Moderate diverticulosis in the sigmoid colon.                           - The examination was otherwise normal on direct                            and retroflexion views.                           - No specimens collected. Recommendation:           - Patient has a contact number available for                            emergencies. The signs and symptoms of potential                            delayed complications were discussed with the                            patient. Return to normal activities tomorrow.                            Written discharge instructions were provided to the                            patient.                           - Resume previous diet.                           - Continue present medications.                            - Repeat colonoscopy in 10 years for screening                            purposes. Iva Boop, MD 11/06/2017 9:32:02 AM This report has been signed electronically.

## 2017-11-06 NOTE — Patient Instructions (Addendum)
No polyps or cancer identified.  You do have diverticulosis - thickened muscle rings and pouches in the colon wall. Please read the handout about this condition.  Next routine colonoscopy or other screening test in 10 years - 2029  I appreciate the opportunity to care for you. Iva Booparl E. Gessner, MD, [facility]  Diverticulosis handout given to patient.  YOU HAD AN ENDOSCOPIC PROCEDURE TODAY AT THE [facility]:   Refer to the procedure report that was given to you for any specific questions about what was found during the examination.  If the procedure report does not answer your questions, please call your gastroenterologist to clarify.  If you requested that your care partner not be given the details of your procedure findings, then the procedure report has been included in a sealed envelope for you to review at your convenience later.  YOU SHOULD EXPECT: Some feelings of bloating in the abdomen. Passage of more gas than usual.  Walking can help get rid of the air that was put into your GI tract during the procedure and reduce the bloating. If you had a lower endoscopy (such as a colonoscopy or flexible sigmoidoscopy) you may notice spotting of blood in your stool or on the toilet paper. If you underwent a bowel prep for your procedure, you may not have a normal bowel movement for a few days.  Please Note:  You might notice some irritation and congestion in your nose or some drainage.  This is from the oxygen used during your procedure.  There is no need for concern and it should clear up in a day or so.  SYMPTOMS TO REPORT IMMEDIATELY:   Following lower endoscopy (colonoscopy or flexible sigmoidoscopy):  Excessive amounts of blood in the stool  Significant tenderness or worsening of abdominal pains  Swelling of the abdomen that is new, acute  Fever of 100F or higher s  For urgent or emergent issues, a gastroenterologist can be reached at any hour by calling [phone]  [phone].   DIET:  We do recommend a small meal at first, but then you may proceed to your regular diet.  Drink plenty of fluids but you should avoid alcoholic beverages for 24 hours.  ACTIVITY:  You should plan to take it easy for the rest of today and you should NOT DRIVE or use heavy machinery until tomorrow (because of the sedation medicines used during the test).    FOLLOW UP: Our staff will call the number listed on your records the next business day following your procedure to check on you and address any questions or concerns that you may have regarding the information given to you following your procedure. If we do not reach you, we will leave a message.  However, if you are feeling well and you are not experiencing any problems, there is no need to return our call.  We will assume that you have returned to your regular daily activities without incident.  If any biopsies were taken you will be contacted by phone or by letter within the next 1-3 weeks.  Please call us at [phone] if you have not heard about the biopsies in 3 weeks.    SIGNATURES/CONFIDENTIALITY: You and/or your care partner have signed paperwork which will be entered into your electronic medical record.  These signatures attest to the fact that that the information above on your After Visit Summary has been reviewed and is understood.  Full responsibility of the confidentiality of this discharge information  lies with you and/or your care-partner.

## 2017-11-06 NOTE — Progress Notes (Signed)
A and O x3. Report to RN. Tolerated MAC anesthesia well.

## 2017-11-06 NOTE — Progress Notes (Signed)
Pt's states no medical or surgical changes since previsit or office visit. 

## 2017-11-09 ENCOUNTER — Telehealth: Payer: Self-pay | Admitting: *Deleted

## 2017-11-09 NOTE — Telephone Encounter (Signed)
  Follow up Call-  Call back number 11/06/2017  Post procedure Call Back phone  # [phone]  Permission to leave phone message Yes  Some recent data might be hidden     Patient questions:  Message left to call us if necessary.

## 2017-11-09 NOTE — Telephone Encounter (Signed)
  Follow up Call-  Call back number 11/06/2017  Post procedure Call Back phone  # [phone]  Permission to leave phone message Yes  Some recent data might be hidden     Patient questions:  Do you have a fever, pain , or abdominal swelling? No. Pain Score  0 *  Have you tolerated food without any problems? Yes.    Have you been able to return to your normal activities? Yes.    Do you have any questions about your discharge instructions: Diet   No. Medications  No. Follow up visit  No.  Do you have questions or concerns about your Care? No.  Actions: * If pain score is 4 or above: No action needed, pain <4.

## 2017-11-12 DIAGNOSIS — M160 Bilateral primary osteoarthritis of hip: Secondary | ICD-10-CM | POA: Diagnosis not present

## 2018-03-16 ENCOUNTER — Encounter: Payer: Self-pay | Admitting: Internal Medicine

## 2018-05-11 ENCOUNTER — Ambulatory Visit: Payer: 59 | Admitting: Internal Medicine

## 2018-05-11 ENCOUNTER — Other Ambulatory Visit (INDEPENDENT_AMBULATORY_CARE_PROVIDER_SITE_OTHER): Payer: 59

## 2018-05-11 ENCOUNTER — Encounter: Payer: Self-pay | Admitting: Internal Medicine

## 2018-05-11 ENCOUNTER — Ambulatory Visit (INDEPENDENT_AMBULATORY_CARE_PROVIDER_SITE_OTHER)
Admission: RE | Admit: 2018-05-11 | Discharge: 2018-05-11 | Disposition: A | Discharge status: Home / Self Care | Payer: 59 | Source: Ambulatory Visit | Attending: Internal Medicine | Admitting: Internal Medicine

## 2018-05-11 VITALS — BP 142/96 | HR 86 | Temp 98.2°F | Ht 71.0 in | Wt 210.0 lb

## 2018-05-11 DIAGNOSIS — Z01818 Encounter for other preprocedural examination: Secondary | ICD-10-CM | POA: Insufficient documentation

## 2018-05-11 DIAGNOSIS — R030 Elevated blood-pressure reading, without diagnosis of hypertension: Secondary | ICD-10-CM

## 2018-05-11 DIAGNOSIS — R739 Hyperglycemia, unspecified: Secondary | ICD-10-CM

## 2018-05-11 DIAGNOSIS — Z23 Encounter for immunization: Secondary | ICD-10-CM | POA: Diagnosis not present

## 2018-05-11 LAB — CBC WITH DIFFERENTIAL/PLATELET
Basophils Absolute: 0.1 10*3/uL (ref 0.0–0.1)
Basophils Relative: 1 % (ref 0.0–3.0)
EOS PCT: 5.5 % — AB (ref 0.0–5.0)
Eosinophils Absolute: 0.3 10*3/uL (ref 0.0–0.7)
HCT: 42.8 % (ref 39.0–52.0)
HEMOGLOBIN: 14.9 g/dL (ref 13.0–17.0)
LYMPHS ABS: 1.9 10*3/uL (ref 0.7–4.0)
Lymphocytes Relative: 33.2 % (ref 12.0–46.0)
MCHC: 34.9 g/dL (ref 30.0–36.0)
MCV: 92.5 fl (ref 78.0–100.0)
Monocytes Absolute: 0.4 10*3/uL (ref 0.1–1.0)
Monocytes Relative: 7 % (ref 3.0–12.0)
Neutro Abs: 3.1 10*3/uL (ref 1.4–7.7)
Neutrophils Relative %: 53.3 % (ref 43.0–77.0)
Platelets: 288 10*3/uL (ref 150.0–400.0)
RBC: 4.63 Mil/uL (ref 4.22–5.81)
RDW: 13.4 % (ref 11.5–15.5)
WBC: 5.8 10*3/uL (ref 4.0–10.5)

## 2018-05-11 LAB — BASIC METABOLIC PANEL
BUN: 13 mg/dL (ref 6–23)
CO2: 30 mEq/L (ref 19–32)
Calcium: 9.6 mg/dL (ref 8.4–10.5)
Chloride: 102 mEq/L (ref 96–112)
Creatinine, Ser: 1.1 mg/dL (ref 0.40–1.50)
GFR: 74.99 mL/min (ref >60.00)
Glucose, Bld: 94 mg/dL (ref 70–99)
Potassium: 4 mEq/L (ref 3.5–5.1)
SODIUM: 140 meq/L (ref 135–145)

## 2018-05-11 LAB — URINALYSIS, ROUTINE W REFLEX MICROSCOPIC
Bilirubin Urine: NEGATIVE
Hgb urine dipstick: NEGATIVE
Ketones, ur: NEGATIVE
LEUKOCYTES UA: NEGATIVE
NITRITE: NEGATIVE
Specific Gravity, Urine: 1.01 (ref 1.000–1.030)
TOTAL PROTEIN, URINE-UPE24: NEGATIVE
URINE GLUCOSE: NEGATIVE
UROBILINOGEN UA: 0.2 (ref 0.0–1.0)
pH: 7 (ref 5.0–8.0)

## 2018-05-11 LAB — VITAMIN D 25 HYDROXY (VIT D DEFICIENCY, FRACTURES): VITD: 43.24 ng/mL (ref 30.00–100.00)

## 2018-05-11 LAB — HEPATIC FUNCTION PANEL
ALBUMIN: 4.8 g/dL (ref 3.5–5.2)
ALT: 19 U/L (ref 0–53)
AST: 23 U/L (ref 0–37)
Alkaline Phosphatase: 52 U/L (ref 39–117)
Bilirubin, Direct: 0.1 mg/dL (ref 0.0–0.3)
Total Bilirubin: 0.8 mg/dL (ref 0.2–1.2)
Total Protein: 7.5 g/dL (ref 6.0–8.3)

## 2018-05-11 LAB — HEMOGLOBIN A1C: HEMOGLOBIN A1C: 5.1 % (ref 4.6–6.5)

## 2018-05-11 NOTE — Assessment & Plan Note (Signed)
Per pt stable with near daily checks at home since jan 2019 last visit; will defer to pt, no specific tx beyond lifestyle today

## 2018-05-11 NOTE — Progress Notes (Signed)
Subjective:    Patient ID: Isaac Gray, male    DOB: 03-16-1966, 52 y.o.   MRN: [id number]  HPI  Here to f/u; overall doing ok,  Pt denies chest pain, increasing sob or doe, wheezing, orthopnea, PND, increased LE swelling, palpitations, dizziness or syncope.  Pt denies new neurological symptoms such as new headache, or facial or extremity weakness or numbness.  Pt denies polydipsia, polyuria, or low sugar episode.  Pt states overall good compliance with meds, mostly trying to follow appropriate diet, with wt overall stable,  but little exercise however.  BP at home have been daily consistently < 130/80 per pt BP Readings from Last 3 Encounters:  05/11/18 (!) 142/96  11/06/17 133/87  08/17/17 (!) 146/98  Due for right hip resurfacing Dec 18 per Dr Michaell Cowing of [facility],  Pt needs ECG, cxr, and preop labs  No new complaints Past Medical History:  Diagnosis Date  . Arthritis    right hip  . Hypertension    no meds   Past Surgical History:  Procedure Laterality Date  . FINGER FRACTURE SURGERY     at 52 years old/ring finger    reports that he has never smoked. He has never used smokeless tobacco. He reports that he drinks about 12.0 standard drinks of alcohol per week. He reports that he does not use drugs. family history includes Bladder Cancer in his paternal grandfather; Diabetes in his father; Heart disease in his maternal grandfather; Hypertension in his mother; Lung cancer in his paternal grandmother; Stroke in his maternal grandmother. No Known Allergies Current Outpatient Medications on File Prior to Visit  Medication Sig Dispense Refill  . glucosamine-chondroitin 500-400 MG tablet Take 2 tablets by mouth daily at 6 (six) AM.    . Multiple Vitamin (MULTIVITAMIN) tablet Take 1 tablet by mouth daily.    . naproxen sodium (ALEVE) 220 MG tablet Take 220 mg by mouth as needed.     No current facility-administered medications on file prior to visit.    Review of Systems  Constitutional: Negative for other unusual diaphoresis or sweats HENT: Negative for ear discharge or swelling Eyes: Negative for other worsening visual disturbances Respiratory: Negative for stridor or other swelling  Gastrointestinal: Negative for worsening distension or other blood Genitourinary: Negative for retention or other urinary change Musculoskeletal: Negative for other MSK pain or swelling Skin: Negative for color change or other new lesions Neurological: Negative for worsening tremors and other numbness  Psychiatric/Behavioral: Negative for worsening agitation or other fatigue All other system neg per pt    Objective:   Physical Exam BP (!) 142/96   Pulse 86   Temp 98.2 F (36.8 C) (Oral)   Ht 5\' 11"  (1.803 m)   Wt 210 lb (95.3 kg)   SpO2 98%   BMI 29.29 kg/m  VS noted,  Constitutional: Pt appears in NAD HENT: Head: NCAT.  Right Ear: External ear normal.  Left Ear: External ear normal.  Eyes: . Pupils are equal, round, and reactive to light. Conjunctivae and EOM are normal Nose: without d/c or deformity Neck: Neck supple. Gross normal ROM Cardiovascular: Normal rate and regular rhythm.   Pulmonary/Chest: Effort normal and breath sounds without rales or wheezing.  Abd:  Soft, NT, ND, + BS, no organomegaly Right hip pain on flexion, walks with limp Neurological: Pt is alert. At baseline orientation, motor grossly intact Skin: Skin is warm. No rashes, other new lesions, no LE edema Psychiatric: Pt behavior is normal without agitation  No other exam findings  ECG today I have personally interpreted: NSR Lab Results  Component Value Date   WBC 7.1 08/17/2017   HGB 14.7 08/17/2017   HCT 43.4 08/17/2017   PLT 272.0 08/17/2017   GLUCOSE 111 (H) 08/17/2017   CHOL 217 (H) 08/17/2017   TRIG 116.0 08/17/2017   HDL 62.10 08/17/2017   LDLCALC 131 (H) 08/17/2017   ALT 20 08/17/2017   AST 23 08/17/2017   NA 142 08/17/2017   K 5.3 (H) 08/17/2017   CL 103 08/17/2017    CREATININE 1.07 08/17/2017   BUN 11 08/17/2017   CO2 26 08/17/2017   TSH 1.48 08/17/2017   PSA 0.61 08/17/2017   HGBA1C 5.4 08/18/2017       Assessment & Plan:

## 2018-05-11 NOTE — Assessment & Plan Note (Signed)
Asympt, minor, for a1c with next labs

## 2018-05-11 NOTE — Patient Instructions (Addendum)
You had the flu shot today  Your EKG was [facility] today  Please continue all other medications as before, and refills have been done if requested.  Please have the pharmacy call with any other refills you may need.  Please continue your efforts at being more active, low cholesterol diet, and weight control.  You are otherwise up to date with prevention measures today.  Please keep your appointments with your specialists as you may have planned  Please go to the XRAY Department in the Basement (go straight as you get off the elevator) for the x-ray testing  Please go to the LAB in the Basement (turn left off the elevator) for the tests to be done today  You will be contacted by phone if any changes need to be made immediately.  Otherwise, you will receive a letter about your results with an explanation, but please check with MyChart first.  Please remember to sign up for MyChart if you have not done so, as this will be important to you in the future with finding out test results, communicating by private email, and scheduling acute appointments online when needed.  Please return in 6 months, or sooner if needed, with Lab testing done 3-5 days before

## 2018-05-11 NOTE — Assessment & Plan Note (Signed)
ecg reviewed, for cxr and labs as requested

## 2018-05-13 ENCOUNTER — Telehealth: Payer: Self-pay

## 2018-05-13 NOTE — Telephone Encounter (Signed)
Forms faxed to [facility] for right hip resurfacing scheduled to be done on 07/28/18.  Lab work and EKG from 05/11/18 OV with PCP faxed in addition to it as requested.  Originals sent to scan.

## 2018-05-18 ENCOUNTER — Encounter: Payer: Self-pay | Admitting: Internal Medicine

## 2018-06-15 ENCOUNTER — Encounter: Payer: Self-pay | Admitting: Internal Medicine

## 2018-06-15 NOTE — Telephone Encounter (Signed)
shirron or lucy to fax recent labs per pt request please

## 2018-06-16 NOTE — Telephone Encounter (Signed)
Faxed chest xray results to [facility] in [place], [place] @ [phone].Marland KitchenRaechel Chute

## 2018-07-27 DIAGNOSIS — Z13820 Encounter for screening for osteoporosis: Secondary | ICD-10-CM | POA: Diagnosis not present

## 2018-07-27 DIAGNOSIS — M1611 Unilateral primary osteoarthritis, right hip: Secondary | ICD-10-CM | POA: Diagnosis not present

## 2018-07-27 DIAGNOSIS — M25552 Pain in left hip: Secondary | ICD-10-CM | POA: Diagnosis not present

## 2018-07-27 DIAGNOSIS — M25551 Pain in right hip: Secondary | ICD-10-CM | POA: Diagnosis not present

## 2018-07-28 DIAGNOSIS — M25851 Other specified joint disorders, right hip: Secondary | ICD-10-CM | POA: Diagnosis not present

## 2018-07-28 DIAGNOSIS — M1611 Unilateral primary osteoarthritis, right hip: Secondary | ICD-10-CM | POA: Diagnosis not present

## 2018-07-28 DIAGNOSIS — M25751 Osteophyte, right hip: Secondary | ICD-10-CM | POA: Diagnosis not present

## 2018-09-09 DIAGNOSIS — M25551 Pain in right hip: Secondary | ICD-10-CM | POA: Diagnosis not present

## 2019-01-21 ENCOUNTER — Ambulatory Visit: Payer: 59 | Admitting: Internal Medicine

## 2019-01-21 ENCOUNTER — Ambulatory Visit (INDEPENDENT_AMBULATORY_CARE_PROVIDER_SITE_OTHER): Payer: 59 | Admitting: Internal Medicine

## 2019-01-21 ENCOUNTER — Other Ambulatory Visit: Payer: Self-pay

## 2019-01-21 ENCOUNTER — Other Ambulatory Visit (INDEPENDENT_AMBULATORY_CARE_PROVIDER_SITE_OTHER): Payer: 59

## 2019-01-21 ENCOUNTER — Encounter: Payer: Self-pay | Admitting: Internal Medicine

## 2019-01-21 VITALS — BP 136/86 | HR 80 | Temp 98.4°F | Ht 71.0 in | Wt 208.0 lb

## 2019-01-21 DIAGNOSIS — R739 Hyperglycemia, unspecified: Secondary | ICD-10-CM

## 2019-01-21 DIAGNOSIS — Z23 Encounter for immunization: Secondary | ICD-10-CM | POA: Diagnosis not present

## 2019-01-21 DIAGNOSIS — Z0001 Encounter for general adult medical examination with abnormal findings: Secondary | ICD-10-CM | POA: Diagnosis not present

## 2019-01-21 DIAGNOSIS — Z01818 Encounter for other preprocedural examination: Secondary | ICD-10-CM | POA: Diagnosis not present

## 2019-01-21 LAB — BASIC METABOLIC PANEL
BUN: 20 mg/dL (ref 6–23)
CO2: 29 mEq/L (ref 19–32)
Calcium: 9.5 mg/dL (ref 8.4–10.5)
Chloride: 102 mEq/L (ref 96–112)
Creatinine, Ser: 1.13 mg/dL (ref 0.40–1.50)
GFR: 68.21 mL/min (ref >60.00)
Glucose, Bld: 103 mg/dL — ABNORMAL HIGH (ref 70–99)
Potassium: 4.6 mEq/L (ref 3.5–5.1)
Sodium: 142 mEq/L (ref 135–145)

## 2019-01-21 LAB — CBC WITH DIFFERENTIAL/PLATELET
Basophils Absolute: 0.1 10*3/uL (ref 0.0–0.1)
Basophils Relative: 1.1 % (ref 0.0–3.0)
Eosinophils Absolute: 0.2 10*3/uL (ref 0.0–0.7)
Eosinophils Relative: 3.3 % (ref 0.0–5.0)
HCT: 43.1 % (ref 39.0–52.0)
Hemoglobin: 15.1 g/dL (ref 13.0–17.0)
Lymphocytes Relative: 34.5 % (ref 12.0–46.0)
Lymphs Abs: 1.6 10*3/uL (ref 0.7–4.0)
MCHC: 35.1 g/dL (ref 30.0–36.0)
MCV: 91.4 fl (ref 78.0–100.0)
Monocytes Absolute: 0.4 10*3/uL (ref 0.1–1.0)
Monocytes Relative: 7.7 % (ref 3.0–12.0)
Neutro Abs: 2.5 10*3/uL (ref 1.4–7.7)
Neutrophils Relative %: 53.4 % (ref 43.0–77.0)
Platelets: 296 10*3/uL (ref 150.0–400.0)
RBC: 4.72 Mil/uL (ref 4.22–5.81)
RDW: 14 % (ref 11.5–15.5)
WBC: 4.7 10*3/uL (ref 4.0–10.5)

## 2019-01-21 LAB — HEPATIC FUNCTION PANEL
ALT: 18 U/L (ref 0–53)
AST: 18 U/L (ref 0–37)
Albumin: 4.6 g/dL (ref 3.5–5.2)
Alkaline Phosphatase: 59 U/L (ref 39–117)
Bilirubin, Direct: 0.2 mg/dL (ref 0.0–0.3)
Total Bilirubin: 0.7 mg/dL (ref 0.2–1.2)
Total Protein: 6.9 g/dL (ref 6.0–8.3)

## 2019-01-21 LAB — TSH: TSH: 1.8 u[IU]/mL (ref 0.35–4.50)

## 2019-01-21 LAB — PSA: PSA: 0.64 ng/mL (ref 0.10–4.00)

## 2019-01-21 LAB — HEMOGLOBIN A1C: Hgb A1c MFr Bld: 5.3 % (ref 4.6–6.5)

## 2019-01-21 NOTE — Addendum Note (Signed)
Addended by: Juliet Rude on: 01/21/2019 08:39 AM   Modules accepted: Orders

## 2019-01-21 NOTE — Progress Notes (Signed)
Subjective:    Patient ID: Isaac Gray, male    DOB: 1966-03-02, 53 y.o.   MRN: [id number]  HPI  Here for wellness and f/u;  Overall doing ok;  Pt denies Chest pain, worsening SOB, DOE, wheezing, orthopnea, PND, worsening LE edema, palpitations, dizziness or syncope.  Pt denies neurological change such as new headache, facial or extremity weakness.  Pt denies polydipsia, polyuria, or low sugar symptoms. Pt states overall good compliance with treatment and medications, good tolerability, and has been trying to follow appropriate diet.  Pt denies worsening depressive symptoms, suicidal ideation or panic. No fever, night sweats, wt loss, loss of appetite, or other constitutional symptoms.  Pt states good ability with ADL's, has low fall risk, home safety reviewed and adequate, no other significant changes in hearing or vision, and only occasionally active with exercise. 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 No falls Past Medical History:  Diagnosis Date  . Arthritis    right hip  . Hypertension    no meds   Past Surgical History:  Procedure Laterality Date  . FINGER FRACTURE SURGERY     at 53 years old/ring finger    reports that he has never smoked. He has never used smokeless tobacco. He reports current alcohol use of about 12.0 standard drinks of alcohol per week. He reports that he does not use drugs. family history includes Bladder Cancer in his paternal grandfather; Diabetes in his father; Heart disease in his maternal grandfather; Hypertension in his mother; Lung cancer in his paternal grandmother; Stroke in his maternal grandmother. No Known Allergies Current Outpatient Medications on File Prior to Visit  Medication Sig Dispense Refill  . glucosamine-chondroitin 500-400 MG tablet Take 2 tablets by mouth  daily at 6 (six) AM.    . Multiple Vitamin (MULTIVITAMIN) tablet Take 1 tablet by mouth daily.    . naproxen sodium (ALEVE) 220 MG tablet Take 220 mg by mouth as needed.     No current facility-administered medications on file prior to visit.    Review of Systems Constitutional: Negative for other unusual diaphoresis, sweats, appetite or weight changes HENT: Negative for other worsening hearing loss, ear pain, facial swelling, mouth sores or neck stiffness.   Eyes: Negative for other worsening pain, redness or other visual disturbance.  Respiratory: Negative for other stridor or swelling Cardiovascular: Negative for other palpitations or other chest pain  Gastrointestinal: Negative for worsening diarrhea or loose stools, blood in stool, distention or other pain Genitourinary: Negative for hematuria, flank pain or other change in urine volume.  Musculoskeletal: Negative for myalgias or other joint swelling.  Skin: Negative for other color change, or other wound or worsening drainage.  Neurological: Negative for other syncope or numbness. Hematological: Negative for other adenopathy or swelling Psychiatric/Behavioral: Negative for hallucinations, other worsening agitation, SI, self-injury, or new decreased concentration All other system neg per pt    Objective:   Physical Exam BP 136/86   Pulse 80   Temp 98.4 F (36.9 C) (Oral)   Ht 5\' 11"  (1.803 m)   Wt 208 lb (94.3 kg)   SpO2 98%   BMI 29.01 kg/m  VS noted,  Constitutional: Pt is oriented to person, place, and time. Appears well-developed and well-nourished, in no significant distress and comfortable Head: Normocephalic and atraumatic  Eyes: Conjunctivae and EOM are normal. Pupils are equal, round, and reactive to light Right Ear: External ear normal without discharge Left Ear: External ear normal without discharge Nose: Nose without discharge or deformity Mouth/Throat: Oropharynx is without other ulcerations and moist  Neck:  Normal range of motion. Neck supple. No JVD present. No tracheal deviation present or significant neck LA or mass Cardiovascular: Normal rate, regular rhythm, normal heart sounds and intact distal pulses.   Pulmonary/Chest: WOB normal and breath sounds without rales or wheezing  Abdominal: Soft. Bowel sounds are normal. NT. No HSM  Musculoskeletal: Normal range of motion. Exhibits no edema Lymphadenopathy: Has no other cervical adenopathy.  Neurological: Pt is alert and oriented to person, place, and time. Pt has normal reflexes. No cranial nerve deficit. Motor grossly intact, Gait intact Skin: Skin is warm and dry. No rash noted or new ulcerations Psychiatric:  Has normal mood and affect. Behavior is normal without agitation No other exam findings Lab Results  Component Value Date   WBC 5.8 05/11/2018   HGB 14.9 05/11/2018   HCT 42.8 05/11/2018   PLT 288.0 05/11/2018   GLUCOSE 94 05/11/2018   CHOL 217 (H) 08/17/2017   TRIG 116.0 08/17/2017   HDL 62.10 08/17/2017   LDLCALC 131 (H) 08/17/2017   ALT 19 05/11/2018   AST 23 05/11/2018   NA 140 05/11/2018   K 4.0 05/11/2018   CL 102 05/11/2018   CREATININE 1.10 05/11/2018   BUN 13 05/11/2018   CO2 30 05/11/2018   TSH 1.48 08/17/2017   PSA 0.61 08/17/2017   HGBA1C 5.1 05/11/2018    ECG today I have personally interpreted:  NSR - no ischemic changes       Assessment & Plan:

## 2019-01-21 NOTE — Patient Instructions (Signed)
You had the Tdap tetanus shot today  Please continue all other medications as before, and refills have been done if requested.  Please have the pharmacy call with any other refills you may need.  Please continue your efforts at being more active, low cholesterol diet, and weight control.  You are otherwise up to date with prevention measures today.  Please keep your appointments with your specialists as you may have planned  Your EKG was OK today  Please go to the LAB in the Basement (turn left off the elevator) for the tests to be done today  You will be contacted by phone if any changes need to be made immediately.  Otherwise, you will receive a letter about your results with an explanation, but please check with MyChart first.  Please remember to sign up for MyChart if you have not done so, as this will be important to you in the future with finding out test results, communicating by private email, and scheduling acute appointments online when needed.  Please return in 1 year for your yearly visit, or sooner if needed, with Lab testing done 3-5 days before

## 2019-01-21 NOTE — Assessment & Plan Note (Signed)
ECG reviewed, OK for planned surgury

## 2019-01-21 NOTE — Assessment & Plan Note (Signed)
stable overall by history and exam, recent data reviewed with pt, and pt to continue medical treatment as before,  to f/u any worsening symptoms or concerns  

## 2019-01-21 NOTE — Assessment & Plan Note (Signed)

## 2019-01-24 ENCOUNTER — Encounter: Payer: Self-pay | Admitting: Internal Medicine

## 2020-06-06 ENCOUNTER — Ambulatory Visit (INDEPENDENT_AMBULATORY_CARE_PROVIDER_SITE_OTHER): Payer: 59 | Admitting: Internal Medicine

## 2020-06-06 ENCOUNTER — Other Ambulatory Visit: Payer: Self-pay

## 2020-06-06 ENCOUNTER — Encounter: Payer: Self-pay | Admitting: Internal Medicine

## 2020-06-06 VITALS — BP 130/90 | HR 81 | Temp 98.4°F | Ht 71.0 in | Wt 208.0 lb

## 2020-06-06 DIAGNOSIS — R030 Elevated blood-pressure reading, without diagnosis of hypertension: Secondary | ICD-10-CM

## 2020-06-06 DIAGNOSIS — Z23 Encounter for immunization: Secondary | ICD-10-CM | POA: Diagnosis not present

## 2020-06-06 DIAGNOSIS — Z0000 Encounter for general adult medical examination without abnormal findings: Secondary | ICD-10-CM | POA: Diagnosis not present

## 2020-06-06 DIAGNOSIS — R739 Hyperglycemia, unspecified: Secondary | ICD-10-CM

## 2020-06-06 DIAGNOSIS — Z1159 Encounter for screening for other viral diseases: Secondary | ICD-10-CM | POA: Diagnosis not present

## 2020-06-06 LAB — BASIC METABOLIC PANEL
BUN: 8 mg/dL (ref 6–23)
CO2: 32 mEq/L (ref 19–32)
Calcium: 9.7 mg/dL (ref 8.4–10.5)
Chloride: 102 mEq/L (ref 96–112)
Creatinine, Ser: 0.97 mg/dL (ref 0.40–1.50)
GFR: 89.36 mL/min (ref >60.00)
Glucose, Bld: 91 mg/dL (ref 70–99)
Potassium: 4.6 mEq/L (ref 3.5–5.1)
Sodium: 142 mEq/L (ref 135–145)

## 2020-06-06 LAB — URINALYSIS, ROUTINE W REFLEX MICROSCOPIC
Bilirubin Urine: NEGATIVE
Hgb urine dipstick: NEGATIVE
Ketones, ur: NEGATIVE
Leukocytes,Ua: NEGATIVE
Nitrite: NEGATIVE
RBC / HPF: NONE SEEN (ref 0–?)
Specific Gravity, Urine: 1.02 (ref 1.000–1.030)
Total Protein, Urine: NEGATIVE
Urine Glucose: NEGATIVE
Urobilinogen, UA: 0.2 (ref 0.0–1.0)
pH: 7 (ref 5.0–8.0)

## 2020-06-06 LAB — CBC WITH DIFFERENTIAL/PLATELET
Basophils Absolute: 0.1 10*3/uL (ref 0.0–0.1)
Basophils Relative: 0.9 % (ref 0.0–3.0)
Eosinophils Absolute: 0.1 10*3/uL (ref 0.0–0.7)
Eosinophils Relative: 2.2 % (ref 0.0–5.0)
HCT: 44.5 % (ref 39.0–52.0)
Hemoglobin: 15.2 g/dL (ref 13.0–17.0)
Lymphocytes Relative: 19.2 % (ref 12.0–46.0)
Lymphs Abs: 1.2 10*3/uL (ref 0.7–4.0)
MCHC: 34.1 g/dL (ref 30.0–36.0)
MCV: 94.4 fl (ref 78.0–100.0)
Monocytes Absolute: 0.3 10*3/uL (ref 0.1–1.0)
Monocytes Relative: 5.8 % (ref 3.0–12.0)
Neutro Abs: 4.3 10*3/uL (ref 1.4–7.7)
Neutrophils Relative %: 71.9 % (ref 43.0–77.0)
Platelets: 278 10*3/uL (ref 150.0–400.0)
RBC: 4.71 Mil/uL (ref 4.22–5.81)
RDW: 12.9 % (ref 11.5–15.5)
WBC: 6 10*3/uL (ref 4.0–10.5)

## 2020-06-06 LAB — LIPID PANEL
Cholesterol: 198 mg/dL (ref 0–200)
HDL: 56.2 mg/dL (ref >39.00)
LDL Cholesterol: 112 mg/dL — ABNORMAL HIGH (ref 0–99)
NonHDL: 142.13
Total CHOL/HDL Ratio: 4
Triglycerides: 151 mg/dL — ABNORMAL HIGH (ref 0.0–149.0)
VLDL: 30.2 mg/dL (ref 0.0–40.0)

## 2020-06-06 LAB — HEPATIC FUNCTION PANEL
ALT: 17 U/L (ref 0–53)
AST: 22 U/L (ref 0–37)
Albumin: 4.7 g/dL (ref 3.5–5.2)
Alkaline Phosphatase: 59 U/L (ref 39–117)
Bilirubin, Direct: 0.1 mg/dL (ref 0.0–0.3)
Total Bilirubin: 0.8 mg/dL (ref 0.2–1.2)
Total Protein: 6.8 g/dL (ref 6.0–8.3)

## 2020-06-06 LAB — HEMOGLOBIN A1C: Hgb A1c MFr Bld: 5.3 % (ref 4.6–6.5)

## 2020-06-06 LAB — TSH: TSH: 1.69 u[IU]/mL (ref 0.35–4.50)

## 2020-06-06 LAB — PSA: PSA: 0.4 ng/mL (ref 0.10–4.00)

## 2020-06-06 NOTE — Patient Instructions (Signed)

## 2020-06-06 NOTE — Progress Notes (Signed)
Subjective:    Patient ID: Isaac Gray, male    DOB: 1966-02-10, 54 y.o.   MRN: [id number]  HPI  Here for wellness and f/u;  Overall doing ok;  Pt denies Chest pain, worsening SOB, DOE, wheezing, orthopnea, PND, worsening LE edema, palpitations, dizziness or syncope.  Pt denies neurological change such as new headache, facial or extremity weakness.  Pt denies polydipsia, polyuria, or low sugar symptoms. Pt states overall good compliance with treatment and medications, good tolerability, and has been trying to follow appropriate diet.  Pt denies worsening depressive symptoms, suicidal ideation or panic. No fever, night sweats, wt loss, loss of appetite, or other constitutional symptoms.  Pt states good ability with ADL's, has low fall risk, home safety reviewed and adequate, no other significant changes in hearing or vision, and only occasionally active with exercise.  No new complaints Past Medical History:  Diagnosis Date  . Arthritis    right hip  . Hypertension    no meds   Past Surgical History:  Procedure Laterality Date  . FINGER FRACTURE SURGERY     at 54 years old/ring finger    reports that he has never smoked. He has never used smokeless tobacco. He reports current alcohol use of about 12.0 standard drinks of alcohol per week. He reports that he does not use drugs. family history includes Bladder Cancer in his paternal grandfather; Diabetes in his father; Heart disease in his maternal grandfather; Hypertension in his mother; Lung cancer in his paternal grandmother; Stroke in his maternal grandmother. No Known Allergies Current Outpatient Medications on File Prior to Visit  Medication Sig Dispense Refill  . glucosamine-chondroitin 500-400 MG tablet Take 2 tablets by mouth daily at 6 (six) AM.    . Multiple Vitamin (MULTIVITAMIN) tablet Take 1 tablet by mouth daily.    . naproxen sodium (ALEVE) 220 MG tablet Take 220 mg by mouth as needed.    . terbinafine (LAMISIL) 250 MG  tablet      No current facility-administered medications on file prior to visit.   Review of Systems All otherwise neg per pt     Objective:   Physical Exam BP 130/90 (BP Location: Left Arm, Patient Position: Sitting, Cuff Size: Large)   Pulse 81   Temp 98.4 F (36.9 C) (Oral)   Ht 5\' 11"  (1.803 m)   Wt 208 lb (94.3 kg)   SpO2 98%   BMI 29.01 kg/m  VS noted,  Constitutional: Pt appears in NAD HENT: Head: NCAT.  Right Ear: External ear normal.  Left Ear: External ear normal.  Eyes: . Pupils are equal, round, and reactive to light. Conjunctivae and EOM are normal Nose: without d/c or deformity Neck: Neck supple. Gross normal ROM Cardiovascular: Normal rate and regular rhythm.   Pulmonary/Chest: Effort normal and breath sounds without rales or wheezing.  Abd:  Soft, NT, ND, + BS, no organomegaly Neurological: Pt is alert. At baseline orientation, motor grossly intact Skin: Skin is warm. No rashes, other new lesions, no LE edema Psychiatric: Pt behavior is normal without agitation  All otherwise neg per pt Lab Results  Component Value Date   WBC 6.0 06/06/2020   HGB 15.2 06/06/2020   HCT 44.5 06/06/2020   PLT 278.0 06/06/2020   GLUCOSE 91 06/06/2020   CHOL 198 06/06/2020   TRIG 151.0 (H) 06/06/2020   HDL 56.20 06/06/2020   LDLCALC 112 (H) 06/06/2020   ALT 17 06/06/2020   AST 22 06/06/2020   NA 142 06/06/2020  K 4.6 06/06/2020   CL 102 06/06/2020   CREATININE 0.97 06/06/2020   BUN 8 06/06/2020   CO2 32 06/06/2020   TSH 1.69 06/06/2020   PSA 0.40 06/06/2020   HGBA1C 5.3 06/06/2020      Assessment & Plan:

## 2020-06-07 LAB — HEPATITIS C ANTIBODY
Hepatitis C Ab: NONREACTIVE
SIGNAL TO CUT-OFF: 0 (ref <1.00)

## 2020-06-10 ENCOUNTER — Encounter: Payer: Self-pay | Admitting: Internal Medicine

## 2020-06-10 NOTE — Assessment & Plan Note (Signed)
To continue to check BP at home and call in 10 days with average

## 2020-06-10 NOTE — Assessment & Plan Note (Signed)

## 2020-06-10 NOTE — Assessment & Plan Note (Signed)
stable overall by history and exam, recent data reviewed with pt, and pt to continue medical treatment as before,  to f/u any worsening symptoms or concerns, for a1c 

## 2020-06-18 IMAGING — DX DG CHEST 2V
2 series · 2 of 2 positions shown · non-contrast
Comparison: Radiographs January 30, 2012.

CLINICAL DATA: Preop for hip surgery.

EXAM:
CHEST - 2 VIEW

[chest pa]
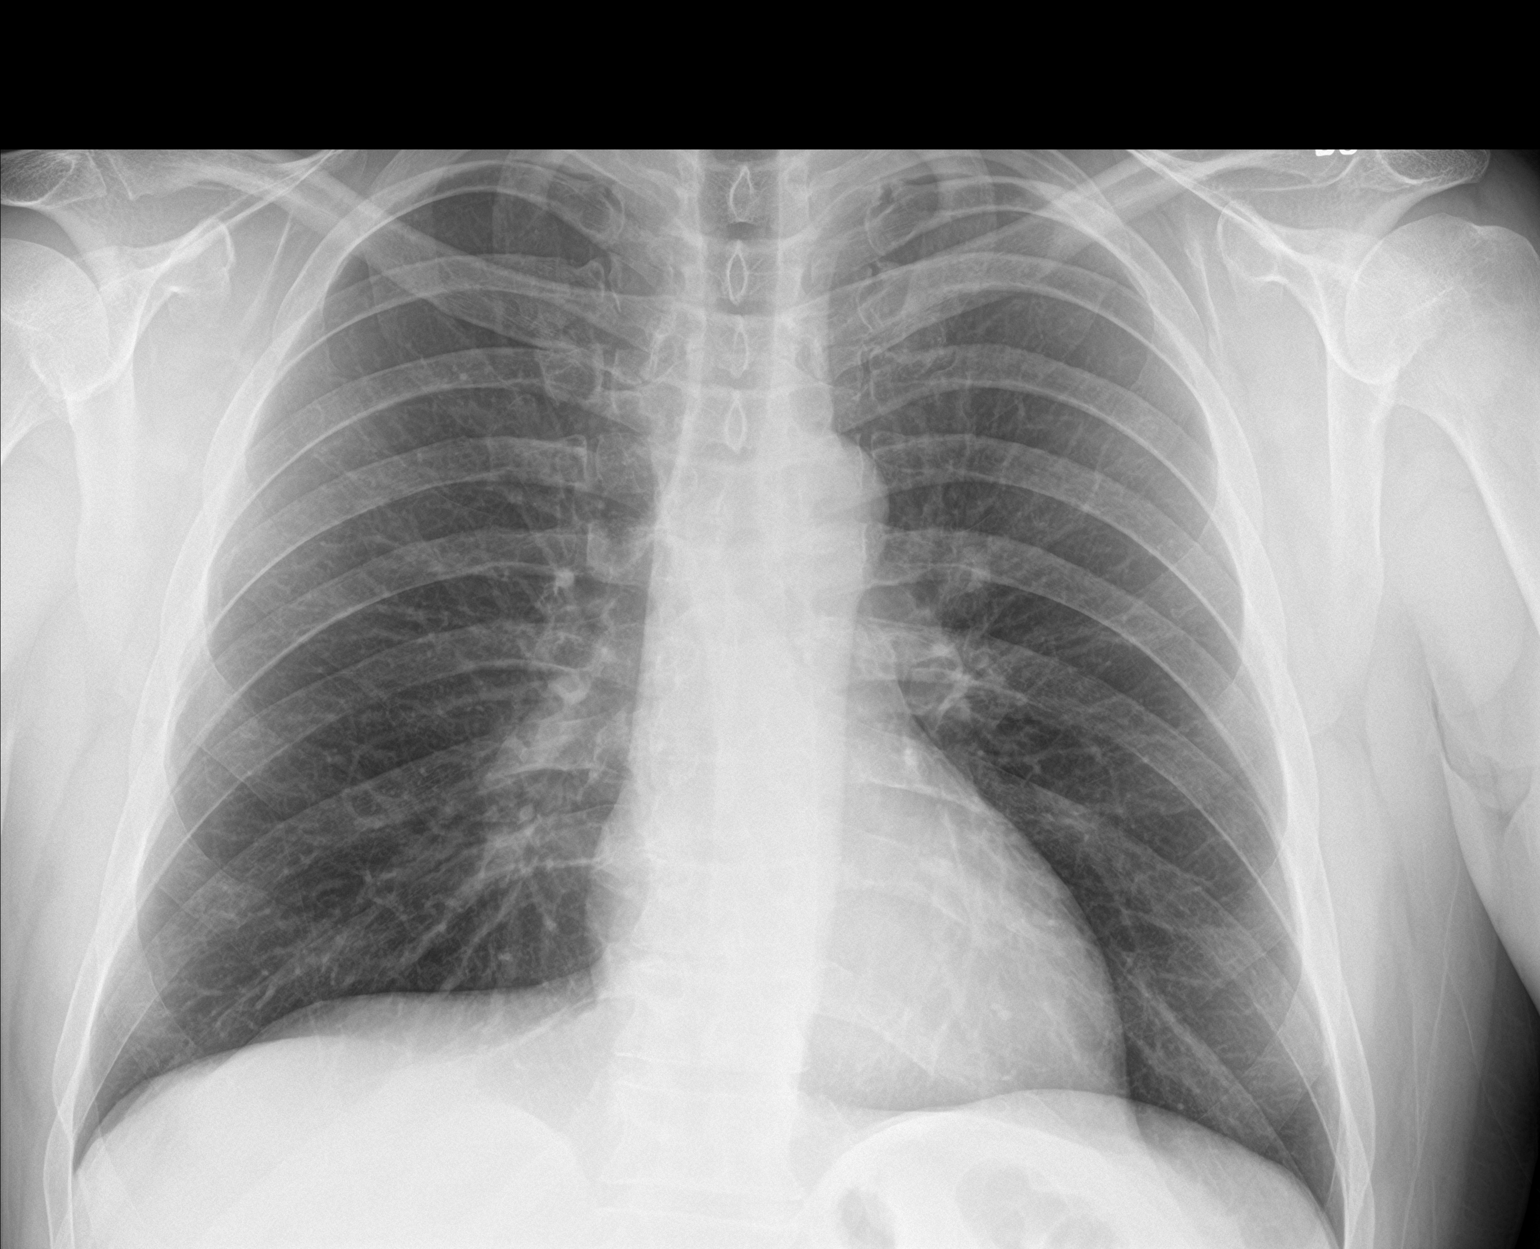

[chest lat]
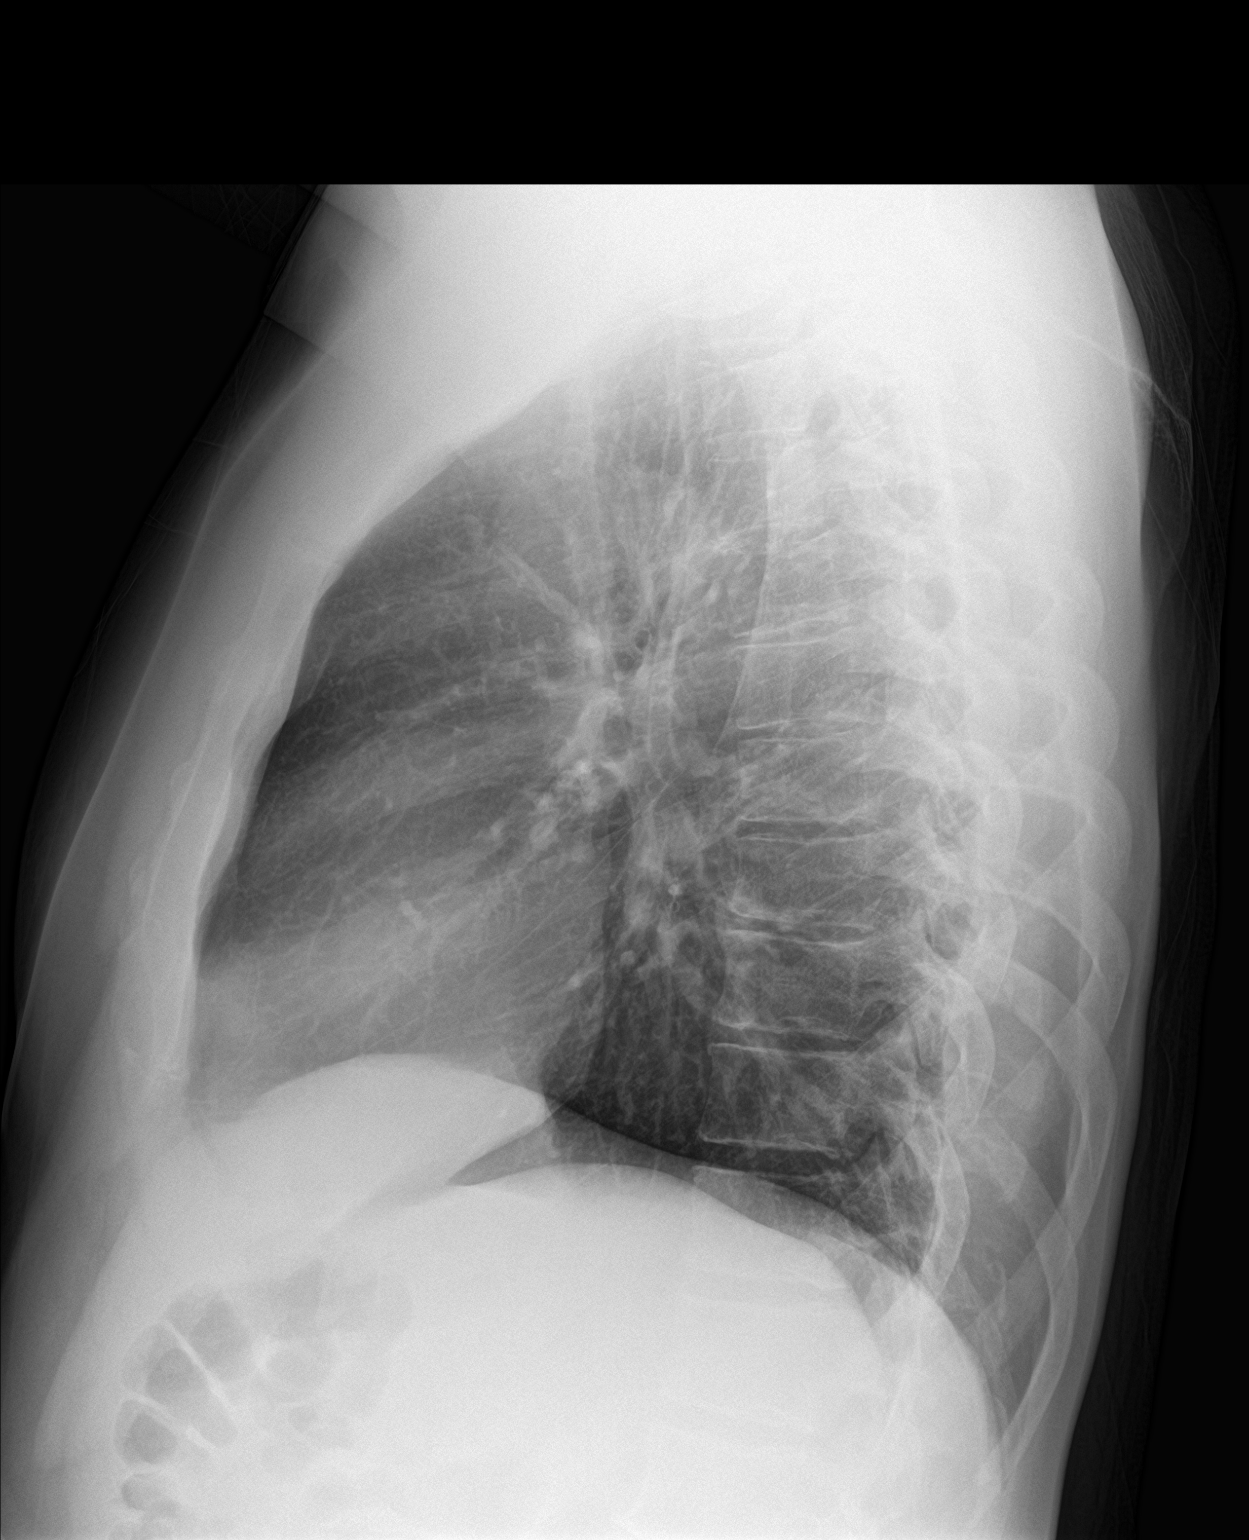

[2 of 2 positions shown; findings below may reference images not displayed]

FINDINGS: The heart size and mediastinal contours are within normal limits.
Both lungs are clear. The visualized skeletal structures are
unremarkable.
IMPRESSION: No active cardiopulmonary disease.

## 2020-11-14 ENCOUNTER — Encounter: Payer: Self-pay | Admitting: Internal Medicine

## 2021-06-11 ENCOUNTER — Encounter: Payer: 59 | Admitting: Internal Medicine

## 2021-06-19 ENCOUNTER — Ambulatory Visit (INDEPENDENT_AMBULATORY_CARE_PROVIDER_SITE_OTHER): Payer: 59 | Admitting: Internal Medicine

## 2021-06-19 ENCOUNTER — Encounter: Payer: Self-pay | Admitting: Internal Medicine

## 2021-06-19 ENCOUNTER — Other Ambulatory Visit: Payer: Self-pay

## 2021-06-19 VITALS — BP 142/78 | HR 80 | Temp 98.1°F | Ht 71.0 in | Wt 172.8 lb

## 2021-06-19 DIAGNOSIS — E785 Hyperlipidemia, unspecified: Secondary | ICD-10-CM | POA: Insufficient documentation

## 2021-06-19 DIAGNOSIS — E559 Vitamin D deficiency, unspecified: Secondary | ICD-10-CM

## 2021-06-19 DIAGNOSIS — N529 Male erectile dysfunction, unspecified: Secondary | ICD-10-CM | POA: Diagnosis not present

## 2021-06-19 DIAGNOSIS — R739 Hyperglycemia, unspecified: Secondary | ICD-10-CM | POA: Diagnosis not present

## 2021-06-19 DIAGNOSIS — Z0001 Encounter for general adult medical examination with abnormal findings: Secondary | ICD-10-CM

## 2021-06-19 DIAGNOSIS — R030 Elevated blood-pressure reading, without diagnosis of hypertension: Secondary | ICD-10-CM | POA: Diagnosis not present

## 2021-06-19 DIAGNOSIS — E538 Deficiency of other specified B group vitamins: Secondary | ICD-10-CM | POA: Diagnosis not present

## 2021-06-19 DIAGNOSIS — E7800 Pure hypercholesterolemia, unspecified: Secondary | ICD-10-CM

## 2021-06-19 LAB — HEPATIC FUNCTION PANEL
ALT: 17 U/L (ref 0–53)
AST: 15 U/L (ref 0–37)
Albumin: 4.2 g/dL (ref 3.5–5.2)
Alkaline Phosphatase: 46 U/L (ref 39–117)
Bilirubin, Direct: 0.2 mg/dL (ref 0.0–0.3)
Total Bilirubin: 0.9 mg/dL (ref 0.2–1.2)
Total Protein: 6.3 g/dL (ref 6.0–8.3)

## 2021-06-19 LAB — LIPID PANEL
Cholesterol: 137 mg/dL (ref 0–200)
HDL: 58.8 mg/dL (ref >39.00)
LDL Cholesterol: 45 mg/dL (ref 0–99)
NonHDL: 78.05
Total CHOL/HDL Ratio: 2
Triglycerides: 163 mg/dL — ABNORMAL HIGH (ref 0.0–149.0)
VLDL: 32.6 mg/dL (ref 0.0–40.0)

## 2021-06-19 LAB — CBC WITH DIFFERENTIAL/PLATELET
Basophils Absolute: 0 10*3/uL (ref 0.0–0.1)
Basophils Relative: 0.7 % (ref 0.0–3.0)
Eosinophils Absolute: 0.1 10*3/uL (ref 0.0–0.7)
Eosinophils Relative: 2.5 % (ref 0.0–5.0)
HCT: 44.5 % (ref 39.0–52.0)
Hemoglobin: 15.2 g/dL (ref 13.0–17.0)
Lymphocytes Relative: 48.9 % — ABNORMAL HIGH (ref 12.0–46.0)
Lymphs Abs: 2.3 10*3/uL (ref 0.7–4.0)
MCHC: 34.2 g/dL (ref 30.0–36.0)
MCV: 94.9 fl (ref 78.0–100.0)
Monocytes Absolute: 0.4 10*3/uL (ref 0.1–1.0)
Monocytes Relative: 8.1 % (ref 3.0–12.0)
Neutro Abs: 1.9 10*3/uL (ref 1.4–7.7)
Neutrophils Relative %: 39.8 % — ABNORMAL LOW (ref 43.0–77.0)
Platelets: 333 10*3/uL (ref 150.0–400.0)
RBC: 4.69 Mil/uL (ref 4.22–5.81)
RDW: 12.8 % (ref 11.5–15.5)
WBC: 4.7 10*3/uL (ref 4.0–10.5)

## 2021-06-19 LAB — BASIC METABOLIC PANEL
BUN: 10 mg/dL (ref 6–23)
CO2: 33 mEq/L — ABNORMAL HIGH (ref 19–32)
Calcium: 9.3 mg/dL (ref 8.4–10.5)
Chloride: 102 mEq/L (ref 96–112)
Creatinine, Ser: 0.91 mg/dL (ref 0.40–1.50)
GFR: 95.78 mL/min (ref >60.00)
Glucose, Bld: 86 mg/dL (ref 70–99)
Potassium: 5 mEq/L (ref 3.5–5.1)
Sodium: 142 mEq/L (ref 135–145)

## 2021-06-19 LAB — URINALYSIS, ROUTINE W REFLEX MICROSCOPIC
Bilirubin Urine: NEGATIVE
Hgb urine dipstick: NEGATIVE
Ketones, ur: NEGATIVE
Leukocytes,Ua: NEGATIVE
Nitrite: NEGATIVE
RBC / HPF: NONE SEEN (ref 0–?)
Specific Gravity, Urine: 1.01 (ref 1.000–1.030)
Total Protein, Urine: NEGATIVE
Urine Glucose: NEGATIVE
Urobilinogen, UA: 0.2 (ref 0.0–1.0)
WBC, UA: NONE SEEN (ref 0–?)
pH: 7 (ref 5.0–8.0)

## 2021-06-19 LAB — VITAMIN B12: Vitamin B-12: 347 pg/mL (ref 211–911)

## 2021-06-19 LAB — TSH: TSH: 2.51 u[IU]/mL (ref 0.35–5.50)

## 2021-06-19 LAB — PSA: PSA: 0.4 ng/mL (ref 0.10–4.00)

## 2021-06-19 LAB — HEMOGLOBIN A1C: Hgb A1c MFr Bld: 5.5 % (ref 4.6–6.5)

## 2021-06-19 LAB — VITAMIN D 25 HYDROXY (VIT D DEFICIENCY, FRACTURES): VITD: 54.29 ng/mL (ref 30.00–100.00)

## 2021-06-19 MED ORDER — TADALAFIL 20 MG PO TABS: 10.0000 mg | ORAL_TABLET | ORAL | 11 refills | Status: DC | PRN

## 2021-06-19 NOTE — Assessment & Plan Note (Signed)
Lab Results  Component Value Date   HGBA1C 5.3 06/06/2020   Stable, pt to continue current medical treatment  - diet and wt control, and f/u a1c today   Current Outpatient Medications (Cardiovascular):  .  tadalafil (CIALIS) 20 MG tablet, Take 0.5-1 tablets (10-20 mg total) by mouth every other day as needed for erectile dysfunction.     Current Outpatient Medications (Other):  .  amoxicillin (AMOXIL) 875 MG tablet, Take 875 mg by mouth 2 (two) times daily. .  Multiple Vitamin (MULTIVITAMIN) tablet, Take 1 tablet by mouth daily.

## 2021-06-19 NOTE — Patient Instructions (Addendum)
Please check with your insurance about the Shingrix shot  Please take all new medication as prescribed - the cialis as needed  We have discussed the Cardiac CT Score test to measure the calcification level (if any) in your heart arteries.  This test has been ordered in our Computer System, so please call [place] [facility] directly, as they prefer this, at [phone] to be scheduled.  Please continue all other medications as before, and refills have been done if requested.  Please have the pharmacy call with any other refills you may need.  Please continue your efforts at being more active, low cholesterol diet, and weight control.  You are otherwise up to date with prevention measures today.  Please keep your appointments with your specialists as you may have planned  Please go to the LAB at the blood drawing area for the tests to be done  You will be contacted by phone if any changes need to be made immediately.  Otherwise, you will receive a letter about your results with an explanation, but please check with MyChart first.  Please remember to sign up for MyChart if you have not done so, as this will be important to you in the future with finding out test results, communicating by private email, and scheduling acute appointments online when needed.  Please make an Appointment to return for your 1 year visit, or sooner if needed

## 2021-06-19 NOTE — Assessment & Plan Note (Signed)
Age and sex appropriate education and counseling updated with regular exercise and diet °Referrals for preventative services - none needed °Immunizations addressed - declines shingrix and covid booster °Smoking counseling  - none needed °Evidence for depression or other mood disorder - none significant °Most recent labs reviewed. °I have personally reviewed and have noted: °1) the patient's medical and social history °2) The patient's current medications and supplements °3) The patient's height, weight, and BMI have been recorded in the chart ° °

## 2021-06-19 NOTE — Assessment & Plan Note (Signed)
BP Readings from Last 3 Encounters:  06/19/21 (!) 142/78  06/06/20 130/90  01/21/19 136/86   Uncontrolled here, but pt takes every few days at home with average about 120's sbp per pt, so declines change in tx at this time, to continue low salt diet, wt control

## 2021-06-19 NOTE — Assessment & Plan Note (Signed)
Lab Results  Component Value Date   LDLCALC 112 (H) 06/06/2020   Uncontrolled mild, for lower chol diet f/u lipids today, declines statin for now but also for Cardiac CT score,

## 2021-06-19 NOTE — Assessment & Plan Note (Signed)
Recent new onset worsening, for cialis prn,  to f/u any worsening symptoms or concerns

## 2021-06-19 NOTE — Progress Notes (Signed)
Patient ID: Isaac Gray, male   DOB: 05/31/1966, 55 y.o.   MRN: [id number]         Chief Complaint:: wellness exam and hld, hyperglycemia, elevated BP without htn, new onset ED        HPI:  Isaac Gray is a 55 y.o. male here for wellness exam; declines shingrix and covid booster for now; o/w up to date                        Also lost wt about 35 lbs in 1 yr intenttionally.  Retired Personnel officer.  C/o  mo worsening mild to mod intermittent ED symptoms with inability to maintain.  Pt denies chest pain, increased sob or doe, wheezing, orthopnea, PND, increased LE swelling, palpitations, dizziness or syncope.   Pt denies polydipsia, polyuria, or new focal neuro s/s.   Pt denies fever, night sweats, loss of appetite, or other constitutional symptoms  Is interested in Card CT scoring.    Wt Readings from Last 3 Encounters:  06/19/21 172 lb 12.8 oz (78.4 kg)  06/06/20 208 lb (94.3 kg)  01/21/19 208 lb (94.3 kg)   BP Readings from Last 3 Encounters:  06/19/21 (!) 142/78  06/06/20 130/90  01/21/19 136/86   Immunization History  Administered Date(s) Administered   Influenza,inj,Quad PF,6+ Mos 05/26/2016, 08/17/2017, 05/11/2018, 06/06/2020   Influenza-Unspecified 05/27/2021   Moderna Sars-Covid-2 Vaccination 11/08/2019, 12/06/2019, 06/25/2020   Tdap 01/21/2019   There are no preventive care reminders to display for this patient.     Past Medical History:  Diagnosis Date   Arthritis    right hip   Hypertension    no meds   Past Surgical History:  Procedure Laterality Date   FINGER FRACTURE SURGERY     at 55 years old/ring finger    reports that he has never smoked. He has never used smokeless tobacco. He reports current alcohol use of about 12.0 standard drinks per week. He reports that he does not use drugs. family history includes Bladder Cancer in his paternal grandfather; Diabetes in his father; Heart disease in his maternal grandfather; Hypertension in his mother; Lung  cancer in his paternal grandmother; Stroke in his maternal grandmother. No Known Allergies Current Outpatient Medications on File Prior to Visit  Medication Sig Dispense Refill   amoxicillin (AMOXIL) 875 MG tablet Take 875 mg by mouth 2 (two) times daily.     Multiple Vitamin (MULTIVITAMIN) tablet Take 1 tablet by mouth daily.     No current facility-administered medications on file prior to visit.        ROS:  All others reviewed and negative.  Objective        PE:  BP (!) 142/78 (BP Location: Left Arm, Patient Position: Sitting, Cuff Size: Large)   Pulse 80   Temp 98.1 F (36.7 C) (Oral)   Ht 5\' 11"  (1.803 m)   Wt 172 lb 12.8 oz (78.4 kg)   SpO2 99%   BMI 24.10 kg/m                 Constitutional: Pt appears in NAD               HENT: Head: NCAT.                Right Ear: External ear normal.                 Left Ear: External ear normal.  Eyes: . Pupils are equal, round, and reactive to light. Conjunctivae and EOM are normal               Nose: without d/c or deformity               Neck: Neck supple. Gross normal ROM               Cardiovascular: Normal rate and regular rhythm.                 Pulmonary/Chest: Effort normal and breath sounds without rales or wheezing.                Abd:  Soft, NT, ND, + BS, no organomegaly               Neurological: Pt is alert. At baseline orientation, motor grossly intact               Skin: Skin is warm. No rashes, no other new lesions, LE edema - none               Psychiatric: Pt behavior is normal without agitation   Micro: none  Cardiac tracings I have personally interpreted today:  none  Pertinent Radiological findings (summarize): none   Lab Results  Component Value Date   WBC 6.0 06/06/2020   HGB 15.2 06/06/2020   HCT 44.5 06/06/2020   PLT 278.0 06/06/2020   GLUCOSE 91 06/06/2020   CHOL 198 06/06/2020   TRIG 151.0 (H) 06/06/2020   HDL 56.20 06/06/2020   LDLCALC 112 (H) 06/06/2020   ALT 17 06/06/2020    AST 22 06/06/2020   NA 142 06/06/2020   K 4.6 06/06/2020   CL 102 06/06/2020   CREATININE 0.97 06/06/2020   BUN 8 06/06/2020   CO2 32 06/06/2020   TSH 1.69 06/06/2020   PSA 0.40 06/06/2020   HGBA1C 5.3 06/06/2020   Assessment/Plan:  Isaac Gray is a 55 y.o. White or Caucasian [1] male with  has a past medical history of Arthritis and Hypertension.  Encounter for well adult exam with abnormal findings Age and sex appropriate education and counseling updated with regular exercise and diet Referrals for preventative services - none needed Immunizations addressed - declines shingrix and covid booster Smoking counseling  - none needed Evidence for depression or other mood disorder - none significant Most recent labs reviewed. I have personally reviewed and have noted: 1) the patient's medical and social history 2) The patient's current medications and supplements 3) The patient's height, weight, and BMI have been recorded in the chart   Erectile dysfunction Recent new onset worsening, for cialis prn,  to f/u any worsening symptoms or concerns  HLD (hyperlipidemia) Lab Results  Component Value Date   LDLCALC 112 (H) 06/06/2020   Uncontrolled mild, for lower chol diet f/u lipids today, declines statin for now but also for Cardiac CT score,   Hyperglycemia Lab Results  Component Value Date   HGBA1C 5.3 06/06/2020   Stable, pt to continue current medical treatment  - diet and wt control, and f/u a1c today   Current Outpatient Medications (Cardiovascular):    tadalafil (CIALIS) 20 MG tablet, Take 0.5-1 tablets (10-20 mg total) by mouth every other day as needed for erectile dysfunction.     Current Outpatient Medications (Other):    amoxicillin (AMOXIL) 875 MG tablet, Take 875 mg by mouth 2 (two) times daily.   Multiple Vitamin (MULTIVITAMIN) tablet, Take 1 tablet by mouth daily.  Blood pressure elevated without history of HTN BP Readings from Last 3  Encounters:  06/19/21 (!) 142/78  06/06/20 130/90  01/21/19 136/86   Uncontrolled here, but pt takes every few days at home with average about 120's sbp per pt, so declines change in tx at this time, to continue low salt diet, wt control  Followup: Return in about 1 year (around 06/19/2022).  Oliver Barre, MD 06/19/2021 8:45 AM [facility]

## 2021-10-31 DIAGNOSIS — R102 Pelvic and perineal pain: Secondary | ICD-10-CM | POA: Insufficient documentation

## 2022-06-24 ENCOUNTER — Encounter: Payer: Self-pay | Admitting: Internal Medicine

## 2022-06-24 ENCOUNTER — Ambulatory Visit (INDEPENDENT_AMBULATORY_CARE_PROVIDER_SITE_OTHER): Payer: 59 | Admitting: Internal Medicine

## 2022-06-24 VITALS — BP 120/66 | HR 76 | Temp 98.0°F | Ht 71.0 in | Wt 179.0 lb

## 2022-06-24 DIAGNOSIS — Z23 Encounter for immunization: Secondary | ICD-10-CM

## 2022-06-24 DIAGNOSIS — E538 Deficiency of other specified B group vitamins: Secondary | ICD-10-CM

## 2022-06-24 DIAGNOSIS — E559 Vitamin D deficiency, unspecified: Secondary | ICD-10-CM

## 2022-06-24 DIAGNOSIS — R739 Hyperglycemia, unspecified: Secondary | ICD-10-CM | POA: Diagnosis not present

## 2022-06-24 DIAGNOSIS — Z125 Encounter for screening for malignant neoplasm of prostate: Secondary | ICD-10-CM

## 2022-06-24 DIAGNOSIS — R030 Elevated blood-pressure reading, without diagnosis of hypertension: Secondary | ICD-10-CM | POA: Diagnosis not present

## 2022-06-24 DIAGNOSIS — E7800 Pure hypercholesterolemia, unspecified: Secondary | ICD-10-CM

## 2022-06-24 DIAGNOSIS — Z0001 Encounter for general adult medical examination with abnormal findings: Secondary | ICD-10-CM

## 2022-06-24 LAB — URINALYSIS, ROUTINE W REFLEX MICROSCOPIC
Bilirubin Urine: NEGATIVE
Hgb urine dipstick: NEGATIVE
Ketones, ur: NEGATIVE
Leukocytes,Ua: NEGATIVE
Nitrite: NEGATIVE
RBC / HPF: NONE SEEN (ref 0–?)
Specific Gravity, Urine: <=1.005 — AB (ref 1.000–1.030)
Total Protein, Urine: NEGATIVE
Urine Glucose: NEGATIVE
Urobilinogen, UA: 0.2 (ref 0.0–1.0)
WBC, UA: NONE SEEN (ref 0–?)
pH: 7 (ref 5.0–8.0)

## 2022-06-24 LAB — TSH: TSH: 1.89 u[IU]/mL (ref 0.35–5.50)

## 2022-06-24 LAB — HEPATIC FUNCTION PANEL
ALT: 20 U/L (ref 0–53)
AST: 24 U/L (ref 0–37)
Albumin: 4.4 g/dL (ref 3.5–5.2)
Alkaline Phosphatase: 62 U/L (ref 39–117)
Bilirubin, Direct: 0.2 mg/dL (ref 0.0–0.3)
Total Bilirubin: 0.7 mg/dL (ref 0.2–1.2)
Total Protein: 6.4 g/dL (ref 6.0–8.3)

## 2022-06-24 LAB — CBC WITH DIFFERENTIAL/PLATELET
Basophils Absolute: 0 10*3/uL (ref 0.0–0.1)
Basophils Relative: 0.6 % (ref 0.0–3.0)
Eosinophils Absolute: 0.2 10*3/uL (ref 0.0–0.7)
Eosinophils Relative: 3.3 % (ref 0.0–5.0)
HCT: 42.6 % (ref 39.0–52.0)
Hemoglobin: 14.4 g/dL (ref 13.0–17.0)
Lymphocytes Relative: 26.6 % (ref 12.0–46.0)
Lymphs Abs: 1.3 10*3/uL (ref 0.7–4.0)
MCHC: 33.8 g/dL (ref 30.0–36.0)
MCV: 95.6 fl (ref 78.0–100.0)
Monocytes Absolute: 0.3 10*3/uL (ref 0.1–1.0)
Monocytes Relative: 6.6 % (ref 3.0–12.0)
Neutro Abs: 3 10*3/uL (ref 1.4–7.7)
Neutrophils Relative %: 62.9 % (ref 43.0–77.0)
Platelets: 268 10*3/uL (ref 150.0–400.0)
RBC: 4.46 Mil/uL (ref 4.22–5.81)
RDW: 12.9 % (ref 11.5–15.5)
WBC: 4.8 10*3/uL (ref 4.0–10.5)

## 2022-06-24 LAB — BASIC METABOLIC PANEL
BUN: 15 mg/dL (ref 6–23)
CO2: 32 mEq/L (ref 19–32)
Calcium: 9.1 mg/dL (ref 8.4–10.5)
Chloride: 102 mEq/L (ref 96–112)
Creatinine, Ser: 0.9 mg/dL (ref 0.40–1.50)
GFR: 96.37 mL/min (ref >60.00)
Glucose, Bld: 91 mg/dL (ref 70–99)
Potassium: 4.5 mEq/L (ref 3.5–5.1)
Sodium: 139 mEq/L (ref 135–145)

## 2022-06-24 LAB — PSA: PSA: 0.36 ng/mL (ref 0.10–4.00)

## 2022-06-24 LAB — LIPID PANEL
Cholesterol: 127 mg/dL (ref 0–200)
HDL: 63.2 mg/dL (ref >39.00)
LDL Cholesterol: 50 mg/dL (ref 0–99)
NonHDL: 64.04
Total CHOL/HDL Ratio: 2
Triglycerides: 69 mg/dL (ref 0.0–149.0)
VLDL: 13.8 mg/dL (ref 0.0–40.0)

## 2022-06-24 LAB — HEMOGLOBIN A1C: Hgb A1c MFr Bld: 5.2 % (ref 4.6–6.5)

## 2022-06-24 LAB — VITAMIN D 25 HYDROXY (VIT D DEFICIENCY, FRACTURES): VITD: 48.35 ng/mL (ref 30.00–100.00)

## 2022-06-24 LAB — VITAMIN B12: Vitamin B-12: 363 pg/mL (ref 211–911)

## 2022-06-24 MED ORDER — TADALAFIL 20 MG PO TABS: 10.0000 mg | ORAL_TABLET | ORAL | 11 refills | Status: AC | PRN

## 2022-06-24 NOTE — Assessment & Plan Note (Signed)
Age and sex appropriate education and counseling updated with regular exercise and diet Referrals for preventative services - none needed Immunizations addressed - declines covid booster, but for flu shot, and then shingrix at pharmacy Smoking counseling  - none needed Evidence for depression or other mood disorder - none significant Most recent labs reviewed. I have personally reviewed and have noted: 1) the patient's medical and social history 2) The patient's current medications and supplements 3) The patient's height, weight, and BMI have been recorded in the chart

## 2022-06-24 NOTE — Patient Instructions (Addendum)
Please have your Shingrix (shingles) shots done at your local pharmacy.'  You had the flu shot today  Please continue all other medications as before, and refills have been done if requested.  Please have the pharmacy call with any other refills you may need.  Please continue your efforts at being more active, low cholesterol diet, and weight control.  You are otherwise up to date with prevention measures today.  Please keep your appointments with your specialists as you may have planned  Please go to the LAB at the blood drawing area for the tests to be done  You will be contacted by phone if any changes need to be made immediately.  Otherwise, you will receive a letter about your results with an explanation, but please check with MyChart first.  Please remember to sign up for MyChart if you have not done so, as this will be important to you in the future with finding out test results, communicating by private email, and scheduling acute appointments online when needed.  Please make an Appointment to return for your 1 year visit, or sooner if needed 

## 2022-06-24 NOTE — Assessment & Plan Note (Signed)
BP Readings from Last 3 Encounters:  06/24/22 120/66  06/19/21 (!) 142/78  06/06/20 130/90   Stable, pt to continue medical treatment  - diet, wt control, low salt

## 2022-06-24 NOTE — Progress Notes (Signed)
Patient ID: Isaac Gray, male   DOB: 1965/10/03, 56 y.o.   MRN: [id number]         Chief Complaint:: wellness exam       HPI:  Isaac Gray is a 56 y.o. male here for wellness exam; declines covid booster but for flu shot today, and shingrx at the pharmacy, o/w up to date                        Also s/p skin ca removed left arm mar 2023, has yearly f/u planned. Pt denies chest pain, increased sob or doe, wheezing, orthopnea, PND, increased LE swelling, palpitations, dizziness or syncope.   Pt denies polydipsia, polyuria, or new focal neuro s/s.    Pt denies fever, night sweats, loss of appetite, or other constitutional symptom  Has overall lost wt from 2021 over 20 lbs. Wt Readings from Last 3 Encounters:  06/24/22 179 lb (81.2 kg)  06/19/21 172 lb 12.8 oz (78.4 kg)  06/06/20 208 lb (94.3 kg)   BP Readings from Last 3 Encounters:  06/24/22 120/66  06/19/21 (!) 142/78  06/06/20 130/90   Immunization History  Administered Date(s) Administered   Influenza,inj,Quad PF,6+ Mos 05/26/2016, 08/17/2017, 05/11/2018, 06/06/2020, 06/24/2022   Influenza-Unspecified 05/27/2021   Moderna Sars-Covid-2 Vaccination 11/08/2019, 12/06/2019, 06/25/2020   Tdap 01/21/2019  There are no preventive care reminders to display for this patient.    Past Medical History:  Diagnosis Date   Arthritis    right hip   Hypertension    no meds   Past Surgical History:  Procedure Laterality Date   FINGER FRACTURE SURGERY     at 56 years old/ring finger    reports that he has never smoked. He has never used smokeless tobacco. He reports current alcohol use of about 12.0 standard drinks of alcohol per week. He reports that he does not use drugs. family history includes Bladder Cancer in his paternal grandfather; Diabetes in his father; Heart disease in his maternal grandfather; Hypertension in his mother; Lung cancer in his paternal grandmother; Stroke in his maternal grandmother. No Known  Allergies Current Outpatient Medications on File Prior to Visit  Medication Sig Dispense Refill   Multiple Vitamin (MULTIVITAMIN) tablet Take 1 tablet by mouth daily.     No current facility-administered medications on file prior to visit.        ROS:  All others reviewed and negative.  Objective        PE:  BP 120/66 (BP Location: Right Arm, Patient Position: Sitting, Cuff Size: Large)   Pulse 76   Temp 98 F (36.7 C) (Oral)   Ht 5\' 11"  (1.803 m)   Wt 179 lb (81.2 kg)   SpO2 98%   BMI 24.97 kg/m                 Constitutional: Pt appears in NAD               HENT: Head: NCAT.                Right Ear: External ear normal.                 Left Ear: External ear normal.                Eyes: . Pupils are equal, round, and reactive to light. Conjunctivae and EOM are normal               Nose:  without d/c or deformity               Neck: Neck supple. Gross normal ROM               Cardiovascular: Normal rate and regular rhythm.                 Pulmonary/Chest: Effort normal and breath sounds without rales or wheezing.                Abd:  Soft, NT, ND, + BS, no organomegaly               Neurological: Pt is alert. At baseline orientation, motor grossly intact               Skin: Skin is warm. No rashes, no other new lesions, LE edema - none               Psychiatric: Pt behavior is normal without agitation   Micro: none  Cardiac tracings I have personally interpreted today:  none  Pertinent Radiological findings (summarize): none   Lab Results  Component Value Date   WBC 4.7 06/19/2021   HGB 15.2 06/19/2021   HCT 44.5 06/19/2021   PLT 333.0 06/19/2021   GLUCOSE 86 06/19/2021   CHOL 137 06/19/2021   TRIG 163.0 (H) 06/19/2021   HDL 58.80 06/19/2021   LDLCALC 45 06/19/2021   ALT 17 06/19/2021   AST 15 06/19/2021   NA 142 06/19/2021   K 5.0 06/19/2021   CL 102 06/19/2021   CREATININE 0.91 06/19/2021   BUN 10 06/19/2021   CO2 33 (H) 06/19/2021   TSH 2.51 06/19/2021    PSA 0.40 06/19/2021   HGBA1C 5.5 06/19/2021   Assessment/Plan:  Isaac Gray is a 56 y.o. White or Caucasian [1] male with  has a past medical history of Arthritis and Hypertension.  Encounter for well adult exam with abnormal findings Age and sex appropriate education and counseling updated with regular exercise and diet Referrals for preventative services - none needed Immunizations addressed - declines covid booster, but for flu shot, and then shingrix at pharmacy Smoking counseling  - none needed Evidence for depression or other mood disorder - none significant Most recent labs reviewed. I have personally reviewed and have noted: 1) the patient's medical and social history 2) The patient's current medications and supplements 3) The patient's height, weight, and BMI have been recorded in the chart   Hyperglycemia Lab Results  Component Value Date   HGBA1C 5.5 06/19/2021   Stable, pt to continue current medical treatment   - diet , wt control  HLD (hyperlipidemia) Lab Results  Component Value Date   LDLCALC 45 06/19/2021   Stable, pt to continue current low chol diet   Blood pressure elevated without history of HTN BP Readings from Last 3 Encounters:  06/24/22 120/66  06/19/21 (!) 142/78  06/06/20 130/90   Stable, pt to continue medical treatment  - diet, wt control, low salt  Followup: Return in about 1 year (around 06/25/2023).  Oliver Barre, MD 06/24/2022 10:10 AM [facility]  Primary Care - [facility] Internal Medicine

## 2022-06-24 NOTE — Assessment & Plan Note (Signed)
Lab Results  Component Value Date   LDLCALC 45 06/19/2021   Stable, pt to continue current low chol diet

## 2022-06-24 NOTE — Assessment & Plan Note (Signed)
Lab Results  Component Value Date   HGBA1C 5.5 06/19/2021   Stable, pt to continue current medical treatment   - diet , wt control

## 2023-07-21 ENCOUNTER — Ambulatory Visit (INDEPENDENT_AMBULATORY_CARE_PROVIDER_SITE_OTHER): Payer: 59 | Admitting: Internal Medicine

## 2023-07-21 ENCOUNTER — Encounter: Payer: Self-pay | Admitting: Internal Medicine

## 2023-07-21 VITALS — BP 116/72 | HR 64 | Temp 98.2°F | Ht 71.0 in | Wt 177.0 lb

## 2023-07-21 DIAGNOSIS — R030 Elevated blood-pressure reading, without diagnosis of hypertension: Secondary | ICD-10-CM

## 2023-07-21 DIAGNOSIS — Z125 Encounter for screening for malignant neoplasm of prostate: Secondary | ICD-10-CM

## 2023-07-21 DIAGNOSIS — R739 Hyperglycemia, unspecified: Secondary | ICD-10-CM

## 2023-07-21 DIAGNOSIS — Z0000 Encounter for general adult medical examination without abnormal findings: Secondary | ICD-10-CM

## 2023-07-21 DIAGNOSIS — E7800 Pure hypercholesterolemia, unspecified: Secondary | ICD-10-CM | POA: Diagnosis not present

## 2023-07-21 DIAGNOSIS — Z0001 Encounter for general adult medical examination with abnormal findings: Secondary | ICD-10-CM

## 2023-07-21 LAB — URINALYSIS, ROUTINE W REFLEX MICROSCOPIC
Bilirubin Urine: NEGATIVE
Hgb urine dipstick: NEGATIVE
Ketones, ur: NEGATIVE
Leukocytes,Ua: NEGATIVE
Nitrite: NEGATIVE
RBC / HPF: NONE SEEN (ref 0–?)
Specific Gravity, Urine: <=1.005 — AB (ref 1.000–1.030)
Total Protein, Urine: NEGATIVE
Urine Glucose: NEGATIVE
Urobilinogen, UA: 0.2 (ref 0.0–1.0)
WBC, UA: NONE SEEN (ref 0–?)
pH: 7 (ref 5.0–8.0)

## 2023-07-21 LAB — LIPID PANEL
Cholesterol: 141 mg/dL (ref 0–200)
HDL: 60 mg/dL (ref >39.00)
LDL Cholesterol: 67 mg/dL (ref 0–99)
NonHDL: 81.07
Total CHOL/HDL Ratio: 2
Triglycerides: 71 mg/dL (ref 0.0–149.0)
VLDL: 14.2 mg/dL (ref 0.0–40.0)

## 2023-07-21 LAB — CBC WITH DIFFERENTIAL/PLATELET
Basophils Absolute: 0 10*3/uL (ref 0.0–0.1)
Basophils Relative: 0.8 % (ref 0.0–3.0)
Eosinophils Absolute: 0.1 10*3/uL (ref 0.0–0.7)
Eosinophils Relative: 3.7 % (ref 0.0–5.0)
HCT: 44.4 % (ref 39.0–52.0)
Hemoglobin: 14.9 g/dL (ref 13.0–17.0)
Lymphocytes Relative: 29.1 % (ref 12.0–46.0)
Lymphs Abs: 1.2 10*3/uL (ref 0.7–4.0)
MCHC: 33.6 g/dL (ref 30.0–36.0)
MCV: 97.2 fL (ref 78.0–100.0)
Monocytes Absolute: 0.3 10*3/uL (ref 0.1–1.0)
Monocytes Relative: 6.8 % (ref 3.0–12.0)
Neutro Abs: 2.4 10*3/uL (ref 1.4–7.7)
Neutrophils Relative %: 59.6 % (ref 43.0–77.0)
Platelets: 278 10*3/uL (ref 150.0–400.0)
RBC: 4.57 Mil/uL (ref 4.22–5.81)
RDW: 13.1 % (ref 11.5–15.5)
WBC: 4 10*3/uL (ref 4.0–10.5)

## 2023-07-21 LAB — HEPATIC FUNCTION PANEL
ALT: 19 U/L (ref 0–53)
AST: 23 U/L (ref 0–37)
Albumin: 4.3 g/dL (ref 3.5–5.2)
Alkaline Phosphatase: 55 U/L (ref 39–117)
Bilirubin, Direct: 0.2 mg/dL (ref 0.0–0.3)
Total Bilirubin: 0.8 mg/dL (ref 0.2–1.2)
Total Protein: 6.3 g/dL (ref 6.0–8.3)

## 2023-07-21 LAB — PSA: PSA: 0.47 ng/mL (ref 0.10–4.00)

## 2023-07-21 LAB — BASIC METABOLIC PANEL
BUN: 13 mg/dL (ref 6–23)
CO2: 32 meq/L (ref 19–32)
Calcium: 9.2 mg/dL (ref 8.4–10.5)
Chloride: 103 meq/L (ref 96–112)
Creatinine, Ser: 0.83 mg/dL (ref 0.40–1.50)
GFR: 98.01 mL/min (ref >60.00)
Glucose, Bld: 89 mg/dL (ref 70–99)
Potassium: 5.1 meq/L (ref 3.5–5.1)
Sodium: 141 meq/L (ref 135–145)

## 2023-07-21 LAB — TSH: TSH: 1.33 u[IU]/mL (ref 0.35–5.50)

## 2023-07-21 LAB — HEMOGLOBIN A1C: Hgb A1c MFr Bld: 5.1 % (ref 4.6–6.5)

## 2023-07-21 NOTE — Assessment & Plan Note (Signed)
.   Lab Results  Component Value Date   HGBA1C 5.2 06/24/2022   Stable, pt to continue current medical treatment  - diet, wt control

## 2023-07-21 NOTE — Progress Notes (Signed)
Patient ID: Isaac Gray, male   DOB: September 16, 1965, 57 y.o.   MRN: [id number]         Chief Complaint:: wellness exam and hyperglycemia, hld, elev bp without htn       HPI:  Isaac Gray is a 57 y.o. male here for wellness exam; declines covid booster, o/w up to date                        Also Pt denies chest pain, increased sob or doe, wheezing, orthopnea, PND, increased LE swelling, palpitations, dizziness or syncope.   Pt denies polydipsia, polyuria, or new focal neuro s/s.    Pt denies fever, wt loss, night sweats, loss of appetite, or other constitutional symptoms     Wt Readings from Last 3 Encounters:  07/21/23 177 lb (80.3 kg)  06/24/22 179 lb (81.2 kg)  06/19/21 172 lb 12.8 oz (78.4 kg)   BP Readings from Last 3 Encounters:  07/21/23 116/72  06/24/22 120/66  06/19/21 (!) 142/78   Immunization History  Administered Date(s) Administered   Influenza,inj,Quad PF,6+ Mos 05/26/2016, 08/17/2017, 05/11/2018, 06/06/2020, 06/24/2022   Influenza-Unspecified 05/27/2021, 05/31/2023   Moderna Sars-Covid-2 Vaccination 11/08/2019, 12/06/2019, 06/25/2020   Tdap 01/21/2019   Zoster Recombinant(Shingrix) 09/07/2022, 10/27/2022   There are no preventive care reminders to display for this patient.     Past Medical History:  Diagnosis Date   Arthritis    right hip   Hypertension    no meds   Past Surgical History:  Procedure Laterality Date   FINGER FRACTURE SURGERY     at 57 years old/ring finger    reports that he has never smoked. He has never used smokeless tobacco. He reports current alcohol use of about 12.0 standard drinks of alcohol per week. He reports that he does not use drugs. family history includes Bladder Cancer in his paternal grandfather; Diabetes in his father; Heart disease in his maternal grandfather; Hypertension in his mother; Lung cancer in his paternal grandmother; Stroke in his maternal grandmother. No Known Allergies Current Outpatient Medications  on File Prior to Visit  Medication Sig Dispense Refill   Multiple Vitamin (MULTIVITAMIN) tablet Take 1 tablet by mouth daily.     tadalafil (CIALIS) 20 MG tablet Take 0.5-1 tablets (10-20 mg total) by mouth every other day as needed for erectile dysfunction. 10 tablet 11   No current facility-administered medications on file prior to visit.        ROS:  All others reviewed and negative.  Objective        PE:  BP 116/72 (BP Location: Right Arm, Patient Position: Sitting, Cuff Size: Normal)   Pulse 64   Temp 98.2 F (36.8 C) (Oral)   Ht 5\' 11"  (1.803 m)   Wt 177 lb (80.3 kg)   SpO2 100%   BMI 24.69 kg/m                 Constitutional: Pt appears in NAD               HENT: Head: NCAT.                Right Ear: External ear normal.                 Left Ear: External ear normal.                Eyes: . Pupils are equal, round, and reactive to light. Conjunctivae and EOM are  normal               Nose: without d/c or deformity               Neck: Neck supple. Gross normal ROM               Cardiovascular: Normal rate and regular rhythm.                 Pulmonary/Chest: Effort normal and breath sounds without rales or wheezing.                Abd:  Soft, NT, ND, + BS, no organomegaly               Neurological: Pt is alert. At baseline orientation, motor grossly intact               Skin: Skin is warm. No rashes, no other new lesions, LE edema - none               Psychiatric: Pt behavior is normal without agitation   Micro: none  Cardiac tracings I have personally interpreted today:  none  Pertinent Radiological findings (summarize): none   Lab Results  Component Value Date   WBC 4.8 06/24/2022   HGB 14.4 06/24/2022   HCT 42.6 06/24/2022   PLT 268.0 06/24/2022   GLUCOSE 91 06/24/2022   CHOL 127 06/24/2022   TRIG 69.0 06/24/2022   HDL 63.20 06/24/2022   LDLCALC 50 06/24/2022   ALT 20 06/24/2022   AST 24 06/24/2022   NA 139 06/24/2022   K 4.5 06/24/2022   CL 102  06/24/2022   CREATININE 0.90 06/24/2022   BUN 15 06/24/2022   CO2 32 06/24/2022   TSH 1.89 06/24/2022   PSA 0.36 06/24/2022   HGBA1C 5.2 06/24/2022   Assessment/Plan:  Isaac Gray is a 57 y.o. White or Caucasian [1] male with  has a past medical history of Arthritis and Hypertension.  Encounter for well adult exam with abnormal findings Age and sex appropriate education and counseling updated with regular exercise and diet Referrals for preventative services - none needed Immunizations addressed - declines covid booster Smoking counseling  - none needed Evidence for depression or other mood disorder - none significant Most recent labs reviewed. I have personally reviewed and have noted: 1) the patient's medical and social history 2) The patient's current medications and supplements 3) The patient's height, weight, and BMI have been recorded in the chart   Hyperglycemia . Lab Results  Component Value Date   HGBA1C 5.2 06/24/2022   Stable, pt to continue current medical treatment  - diet, wt control   HLD (hyperlipidemia) Lab Results  Component Value Date   LDLCALC 50 06/24/2022   Stable, pt to continue current statin  - diet, wt control   Blood pressure elevated without history of HTN BP Readings from Last 3 Encounters:  07/21/23 116/72  06/24/22 120/66  06/19/21 (!) 142/78   Stable, pt to continue medical treatment  - diet wt control  Followup: Return in about 1 year (around 07/20/2024).  Isaac Barre, MD 07/21/2023 8:02 PM [facility] [place] [facility] - [facility] Internal Medicine

## 2023-07-21 NOTE — Patient Instructions (Signed)

## 2023-07-21 NOTE — Assessment & Plan Note (Signed)

## 2023-07-21 NOTE — Assessment & Plan Note (Signed)
BP Readings from Last 3 Encounters:  07/21/23 116/72  06/24/22 120/66  06/19/21 (!) 142/78   Stable, pt to continue medical treatment  - diet wt control

## 2023-07-21 NOTE — Assessment & Plan Note (Signed)
Lab Results  Component Value Date   LDLCALC 50 06/24/2022   Stable, pt to continue current statin  - diet, wt control

## 2023-07-22 NOTE — Progress Notes (Signed)
The test results show that your current treatment is OK, as the tests are stable.  Please continue the same plan.  There is no other need for change of treatment or further evaluation based on these results, at this time.  thanks 

## 2024-07-27 ENCOUNTER — Encounter: Payer: Self-pay | Admitting: Internal Medicine

## 2024-07-27 ENCOUNTER — Ambulatory Visit: Payer: Self-pay | Admitting: Internal Medicine

## 2024-07-27 ENCOUNTER — Ambulatory Visit: Admitting: Internal Medicine

## 2024-07-27 VITALS — HR 63 | Temp 98.3°F | Ht 71.0 in | Wt 184.2 lb

## 2024-07-27 DIAGNOSIS — R739 Hyperglycemia, unspecified: Secondary | ICD-10-CM | POA: Diagnosis not present

## 2024-07-27 DIAGNOSIS — Z0000 Encounter for general adult medical examination without abnormal findings: Secondary | ICD-10-CM

## 2024-07-27 DIAGNOSIS — Z0001 Encounter for general adult medical examination with abnormal findings: Secondary | ICD-10-CM

## 2024-07-27 DIAGNOSIS — Z125 Encounter for screening for malignant neoplasm of prostate: Secondary | ICD-10-CM

## 2024-07-27 DIAGNOSIS — E7800 Pure hypercholesterolemia, unspecified: Secondary | ICD-10-CM | POA: Diagnosis not present

## 2024-07-27 LAB — CBC WITH DIFFERENTIAL/PLATELET
Basophils Absolute: 0 K/uL (ref 0.0–0.1)
Basophils Relative: 1 % (ref 0.0–3.0)
Eosinophils Absolute: 0.2 K/uL (ref 0.0–0.7)
Eosinophils Relative: 3.8 % (ref 0.0–5.0)
HCT: 41.6 % (ref 39.0–52.0)
Hemoglobin: 14.3 g/dL (ref 13.0–17.0)
Lymphocytes Relative: 30.6 % (ref 12.0–46.0)
Lymphs Abs: 1.4 K/uL (ref 0.7–4.0)
MCHC: 34.5 g/dL (ref 30.0–36.0)
MCV: 94.4 fl (ref 78.0–100.0)
Monocytes Absolute: 0.4 K/uL (ref 0.1–1.0)
Monocytes Relative: 8 % (ref 3.0–12.0)
Neutro Abs: 2.5 K/uL (ref 1.4–7.7)
Neutrophils Relative %: 56.6 % (ref 43.0–77.0)
Platelets: 247 K/uL (ref 150.0–400.0)
RBC: 4.4 Mil/uL (ref 4.22–5.81)
RDW: 12.8 % (ref 11.5–15.5)
WBC: 4.4 K/uL (ref 4.0–10.5)

## 2024-07-27 LAB — URINALYSIS, ROUTINE W REFLEX MICROSCOPIC
Bilirubin Urine: NEGATIVE
Hgb urine dipstick: NEGATIVE
Ketones, ur: NEGATIVE
Leukocytes,Ua: NEGATIVE
Nitrite: NEGATIVE
RBC / HPF: NONE SEEN (ref 0–?)
Specific Gravity, Urine: <=1.005 — AB (ref 1.000–1.030)
Total Protein, Urine: NEGATIVE
Urine Glucose: NEGATIVE
Urobilinogen, UA: 0.2 (ref 0.0–1.0)
pH: 7.5 (ref 5.0–8.0)

## 2024-07-27 LAB — BASIC METABOLIC PANEL WITH GFR
BUN: 12 mg/dL (ref 6–23)
CO2: 29 meq/L (ref 19–32)
Calcium: 8.7 mg/dL (ref 8.4–10.5)
Chloride: 99 meq/L (ref 96–112)
Creatinine, Ser: 0.75 mg/dL (ref 0.40–1.50)
GFR: 100.34 mL/min (ref >60.00)
Glucose, Bld: 88 mg/dL (ref 70–99)
Potassium: 4.2 meq/L (ref 3.5–5.1)
Sodium: 135 meq/L (ref 135–145)

## 2024-07-27 LAB — LIPID PANEL
Cholesterol: 126 mg/dL (ref 28–200)
HDL: 57.2 mg/dL (ref >39.00)
LDL Cholesterol: 61 mg/dL (ref 10–99)
NonHDL: 69.16
Total CHOL/HDL Ratio: 2
Triglycerides: 43 mg/dL (ref 10.0–149.0)
VLDL: 8.6 mg/dL (ref 0.0–40.0)

## 2024-07-27 LAB — TSH: TSH: 1.54 u[IU]/mL (ref 0.35–5.50)

## 2024-07-27 LAB — HEPATIC FUNCTION PANEL
ALT: 17 U/L (ref 3–53)
AST: 24 U/L (ref 5–37)
Albumin: 4.3 g/dL (ref 3.5–5.2)
Alkaline Phosphatase: 50 U/L (ref 39–117)
Bilirubin, Direct: 0.1 mg/dL (ref 0.1–0.3)
Total Bilirubin: 0.7 mg/dL (ref 0.2–1.2)
Total Protein: 6.4 g/dL (ref 6.0–8.3)

## 2024-07-27 LAB — PSA: PSA: 0.5 ng/mL (ref 0.10–4.00)

## 2024-07-27 LAB — HEMOGLOBIN A1C: Hgb A1c MFr Bld: 5 % (ref 4.6–6.5)

## 2024-07-27 NOTE — Progress Notes (Signed)
 Patient ID: Isaac Gray, male   DOB: 1966-03-02, 58 y.o.   MRN: [id number]         Chief Complaint:: wellness exam and hld, hyperglycemia       HPI:  Isaac Gray is a 58 y.o. male here for wellness exam; declines all immunizations, o/w up to date                        Also Pt denies chest pain, increased sob or doe, wheezing, orthopnea, PND, increased LE swelling, palpitations, dizziness or syncope.   Pt denies polydipsia, polyuria, or new focal neuro s/s.    Pt denies fever, wt loss, night sweats, loss of appetite, or other constitutional symptoms     Wt Readings from Last 3 Encounters:  07/27/24 184 lb 4 oz (83.6 kg)  07/21/23 177 lb (80.3 kg)  06/24/22 179 lb (81.2 kg)   BP Readings from Last 3 Encounters:  07/21/23 116/72  06/24/22 120/66  06/19/21 (!) 142/78   Immunization History  Administered Date(s) Administered   Influenza,inj,Quad PF,6+ Mos 05/26/2016, 08/17/2017, 05/11/2018, 06/06/2020, 06/24/2022   Influenza-Unspecified 05/27/2021, 05/31/2023   Moderna Sars-Covid-2 Vaccination 11/08/2019, 12/06/2019, 06/25/2020   Tdap 01/21/2019   Zoster Recombinant(Shingrix) 09/07/2022, 10/27/2022   Health Maintenance Due  Topic Date Due   Hepatitis B Vaccines 19-59 Average Risk (1 of 3 - 19+ 3-dose series) Never done   Pneumococcal Vaccine: 50+ Years (1 of 1 - PCV) Never done   Influenza Vaccine  03/11/2024   COVID-19 Vaccine (4 - 2025-26 season) 04/11/2024      Past Medical History:  Diagnosis Date   Arthritis    right hip   Hypertension    no meds   Past Surgical History:  Procedure Laterality Date   FINGER FRACTURE SURGERY     at 58 years old/ring finger    reports that he has never smoked. He has never used smokeless tobacco. He reports current alcohol use of about 12.0 standard drinks of alcohol per week. He reports that he does not use drugs. family history includes Bladder Cancer in his paternal grandfather; Diabetes in his father; Heart disease in  his maternal grandfather; Hypertension in his mother; Lung cancer in his paternal grandmother; Stroke in his maternal grandmother. Allergies[1] Medications Ordered Prior to Encounter[2]      ROS:  All others reviewed and negative.  Objective        PE:  Pulse 63   Temp 98.3 F (36.8 C) (Temporal)   Ht 5' 11 (1.803 m)   Wt 184 lb 4 oz (83.6 kg)   SpO2 99%   BMI 25.70 kg/m                 Constitutional: Pt appears in NAD               HENT: Head: NCAT.                Right Ear: External ear normal.                 Left Ear: External ear normal.                Eyes: . Pupils are equal, round, and reactive to light. Conjunctivae and EOM are normal               Nose: without d/c or deformity               Neck: Neck supple.  Gross normal ROM               Cardiovascular: Normal rate and regular rhythm.                 Pulmonary/Chest: Effort normal and breath sounds without rales or wheezing.                Abd:  Soft, NT, ND, + BS, no organomegaly               Neurological: Pt is alert. At baseline orientation, motor grossly intact               Skin: Skin is warm. No rashes, no other new lesions, LE edema - none               Psychiatric: Pt behavior is normal without agitation   Micro: none  Cardiac tracings I have personally interpreted today:  none  Pertinent Radiological findings (summarize): none   Lab Results  Component Value Date   WBC 4.4 07/27/2024   HGB 14.3 07/27/2024   HCT 41.6 07/27/2024   PLT 247.0 07/27/2024   GLUCOSE 88 07/27/2024   CHOL 126 07/27/2024   TRIG 43.0 07/27/2024   HDL 57.20 07/27/2024   LDLCALC 61 07/27/2024   ALT 17 07/27/2024   AST 24 07/27/2024   NA 135 07/27/2024   K 4.2 07/27/2024   CL 99 07/27/2024   CREATININE 0.75 07/27/2024   BUN 12 07/27/2024   CO2 29 07/27/2024   TSH 1.54 07/27/2024   PSA 0.50 07/27/2024   HGBA1C 5.0 07/27/2024   Assessment/Plan:  Isaac Gray is a 58 y.o. White or Caucasian [1] male with  has a  past medical history of Arthritis and Hypertension.  Preventative health care Age and sex appropriate education and counseling updated with regular exercise and diet Referrals for preventative services - none needed Immunizations addressed - declines all today Smoking counseling  - none needed Evidence for depression or other mood disorder - none significant Most recent labs reviewed. I have personally reviewed and have noted: 1) the patient's medical and social history 2) The patient's current medications and supplements 3) The patient's height, weight, and BMI have been recorded in the chart   HLD (hyperlipidemia) Lab Results  Component Value Date   LDLCALC 61 07/27/2024   Stable, pt to continue lower chol diet  Hyperglycemia Lab Results  Component Value Date   HGBA1C 5.0 07/27/2024   Stable, pt to cont diet, wt control  Followup: Return in about 1 year (around 07/27/2025).  Isaac Rush, MD 07/27/2024 8:54 PM [facility]     [1] No Known Allergies [2]  Current Outpatient Medications on File Prior to Visit  Medication Sig Dispense Refill   Multiple Vitamin (MULTIVITAMIN) tablet Take 1 tablet by mouth daily.     tadalafil  (CIALIS ) 20 MG tablet Take 0.5-1 tablets (10-20 mg total) by mouth every other day as needed for erectile dysfunction. 10 tablet 11   No current facility-administered medications on file prior to visit.

## 2024-07-27 NOTE — Assessment & Plan Note (Signed)
 Lab Results  Component Value Date   HGBA1C 5.0 07/27/2024   Stable, pt to cont diet, wt control

## 2024-07-27 NOTE — Progress Notes (Signed)
 The test results show that your current treatment is OK, as the tests are stable.  Please continue the same plan.  There is no other need for change of treatment or further evaluation based on these results, at this time.  thanks

## 2024-07-27 NOTE — Patient Instructions (Signed)

## 2024-07-27 NOTE — Assessment & Plan Note (Signed)
 Age and sex appropriate education and counseling updated with regular exercise and diet Referrals for preventative services - none needed Immunizations addressed - declines all today Smoking counseling  - none needed Evidence for depression or other mood disorder - none significant Most recent labs reviewed. I have personally reviewed and have noted: 1) the patient's medical and social history 2) The patient's current medications and supplements 3) The patient's height, weight, and BMI have been recorded in the chart

## 2024-07-27 NOTE — Assessment & Plan Note (Signed)
 Lab Results  Component Value Date   LDLCALC 61 07/27/2024   Stable, pt to continue lower chol diet

## 2024-07-28 ENCOUNTER — Encounter: Admitting: Internal Medicine
